# Patient Record
Sex: Female | Born: 2000 | Hispanic: Yes | Marital: Single | Smoking: Never smoker
Health system: Southern US, Community
[De-identification: ages and names within clinical notes are randomized; demographics above are authoritative.]

## PROBLEM LIST (undated history)

## (undated) DIAGNOSIS — J45909 Unspecified asthma, uncomplicated: Secondary | ICD-10-CM

## (undated) DIAGNOSIS — K219 Gastro-esophageal reflux disease without esophagitis: Secondary | ICD-10-CM

## (undated) DIAGNOSIS — F32A Depression, unspecified: Secondary | ICD-10-CM

## (undated) DIAGNOSIS — F419 Anxiety disorder, unspecified: Secondary | ICD-10-CM

## (undated) DIAGNOSIS — T7840XA Allergy, unspecified, initial encounter: Secondary | ICD-10-CM

## (undated) DIAGNOSIS — Z973 Presence of spectacles and contact lenses: Secondary | ICD-10-CM

## (undated) DIAGNOSIS — F3181 Bipolar II disorder: Secondary | ICD-10-CM

## (undated) DIAGNOSIS — L309 Dermatitis, unspecified: Secondary | ICD-10-CM

## (undated) HISTORY — DX: Allergy, unspecified, initial encounter: T78.40XA

## (undated) HISTORY — DX: Bipolar II disorder: F31.81

## (undated) HISTORY — PX: NO PAST SURGERIES: SHX2092

## (undated) HISTORY — DX: Gastro-esophageal reflux disease without esophagitis: K21.9

## (undated) HISTORY — DX: Depression, unspecified: F32.A

## (undated) HISTORY — DX: Anxiety disorder, unspecified: F41.9

---
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
## 2018-06-11 ENCOUNTER — Emergency Department
Admission: EM | Admit: 2018-06-11 | Discharge: 2018-06-11 | Disposition: A | Discharge status: Home / Self Care | Payer: No Typology Code available for payment source | Attending: Emergency Medicine | Admitting: Emergency Medicine

## 2018-06-11 ENCOUNTER — Other Ambulatory Visit: Payer: Self-pay

## 2018-06-11 ENCOUNTER — Emergency Department: Payer: No Typology Code available for payment source

## 2018-06-11 DIAGNOSIS — Y990 Civilian activity done for income or pay: Secondary | ICD-10-CM | POA: Diagnosis not present

## 2018-06-11 DIAGNOSIS — J45909 Unspecified asthma, uncomplicated: Secondary | ICD-10-CM | POA: Insufficient documentation

## 2018-06-11 DIAGNOSIS — S0990XA Unspecified injury of head, initial encounter: Secondary | ICD-10-CM | POA: Diagnosis present

## 2018-06-11 DIAGNOSIS — W01198A Fall on same level from slipping, tripping and stumbling with subsequent striking against other object, initial encounter: Secondary | ICD-10-CM | POA: Diagnosis not present

## 2018-06-11 DIAGNOSIS — S060X0A Concussion without loss of consciousness, initial encounter: Secondary | ICD-10-CM | POA: Diagnosis not present

## 2018-06-11 DIAGNOSIS — Y9389 Activity, other specified: Secondary | ICD-10-CM | POA: Insufficient documentation

## 2018-06-11 DIAGNOSIS — W19XXXA Unspecified fall, initial encounter: Secondary | ICD-10-CM

## 2018-06-11 DIAGNOSIS — Y929 Unspecified place or not applicable: Secondary | ICD-10-CM | POA: Insufficient documentation

## 2018-06-11 HISTORY — DX: Dermatitis, unspecified: L30.9

## 2018-06-11 HISTORY — DX: Unspecified asthma, uncomplicated: J45.909

## 2018-06-11 MED ORDER — MELOXICAM 15 MG PO TABS: 15.0000 mg | ORAL_TABLET | Freq: Every day | ORAL | 0 refills | Status: DC

## 2018-06-11 NOTE — ED Notes (Signed)
See PA assessment. Neurologically intact, no n/v, no LoC. Pt fell at work, struck head on floor, became dizzy and started dropping things.

## 2018-06-11 NOTE — ED Notes (Signed)
No orders at this time per MD [facility]

## 2018-06-11 NOTE — ED Provider Notes (Signed)
[facility] Emergency Department Provider Note  ____________________________________________  Time seen: Approximately 10:56 PM  I have reviewed the triage vital signs and the nursing notes.   HISTORY  Chief Complaint Head Injury    HPI Kelly Pace is a 17 y.o. female who presents the emergency department complaining of headache, dizziness, blurred vision, dropping items status post a head injury.  Patient was at work when she slipped, fell hitting her head on the floor.  Patient reports that she did not lose consciousness but immediately had a headache.  Patient is felt dizzy throughout the day, having intermittent blurred vision.  Patient reports that her coworkers have told her that she is not acting her normal self.  She is also found herself dropping items without provocation.  Patient denies any neck pain, chest pain, shortness of breath, abdominal pain, nausea or vomiting.  No medication for this complaint prior to arrival.  No previous history of concussion.  No other complaints at this time.    Past Medical History:  Diagnosis Date  . Asthma   . Eczema     There are no active problems to display for this patient.   History reviewed. No pertinent surgical history.  Prior to Admission medications   Medication Sig Start Date End Date Taking? Authorizing Provider  meloxicam (MOBIC) 15 MG tablet Take 1 tablet (15 mg total) by mouth daily. 06/11/18   Cuthriell, Delorise Royals, PA-C    Allergies Cashew nut oil and Fish allergy  No family history on file.  Social History Social History   Tobacco Use  . Smoking status: Never Smoker  . Smokeless tobacco: Never Used  Substance Use Topics  . Alcohol use: Never    Frequency: Never  . Drug use: Not on file     Review of Systems  Constitutional: No fever/chills Eyes: No visual changes.  Cardiovascular: no chest pain. Respiratory: no cough. No SOB. Gastrointestinal: No abdominal pain.  No nausea,  no vomiting. Musculoskeletal: Negative for musculoskeletal pain. Skin: Negative for rash, abrasions, lacerations, ecchymosis. Neurological: Positive for headache but denies focal weakness or numbness.  Dizziness.  Not acting "normal self" per coworkers 10-point ROS otherwise negative.  ____________________________________________   PHYSICAL EXAM:  VITAL SIGNS: ED Triage Vitals  Enc Vitals Group     BP 06/11/18 2128 124/74     Pulse Rate 06/11/18 2128 (!) 107     Resp 06/11/18 2128 18     Temp 06/11/18 2128 98.6 F (37 C)     Temp Source 06/11/18 2128 Oral     SpO2 06/11/18 2128 94 %     Weight 06/11/18 2129 173 lb (78.5 kg)     Height 06/11/18 2129 5\' 1"  (1.549 m)     Head Circumference --      Peak Flow --      Pain Score 06/11/18 2129 7     Pain Loc --      Pain Edu? --      Excl. in GC? --      Constitutional: Alert and oriented. Well appearing and in no acute distress. Eyes: Conjunctivae are normal. PERRL. EOMI. Head: Atraumatic.  No visible hematoma, urgency or lacerations.  Diffuse tenderness to palpation of the occipital skull with no palpable abnormality or crepitus.  No battle signs, raccoon eyes, serosanguineous fluid drainage from the ears or nares. ENT:      Ears:       Nose: No congestion/rhinnorhea.      Mouth/Throat:  Mucous membranes are moist.  Neck: No stridor.  No cervical spine tenderness to palpation.  Cardiovascular: Normal rate, regular rhythm. Normal S1 and S2.  Good peripheral circulation. Respiratory: Normal respiratory effort without tachypnea or retractions. Lungs CTAB. Good air entry to the bases with no decreased or absent breath sounds. Musculoskeletal: Full range of motion to all extremities. No gross deformities appreciated. Neurologic:  Normal speech and language. No gross focal neurologic deficits are appreciated.  Skin:  Skin is warm, dry and intact. No rash noted.  Cranial nerves II through XII grossly intact. Psychiatric: Mood and  affect are normal. Speech and behavior are normal. Patient exhibits appropriate insight and judgement.   ____________________________________________   LABS (all labs ordered are listed, but only abnormal results are displayed)  Labs Reviewed - No data to display ____________________________________________  EKG   ____________________________________________  RADIOLOGY I personally viewed and evaluated these images as part of my medical decision making, as well as reviewing the written report by the radiologist.  I concur with radiologist finding of no acute intracranial osseous abnormality.  Ct Head Wo Contrast  Result Date: 06/11/2018 CLINICAL DATA:  Headache EXAM: CT HEAD WITHOUT CONTRAST TECHNIQUE: Contiguous axial images were obtained from the base of the skull through the vertex without intravenous contrast. COMPARISON:  None. FINDINGS: Brain: No evidence of acute infarction, hemorrhage, hydrocephalus, extra-axial collection or mass lesion/mass effect. Vascular: No hyperdense vessel or unexpected calcification. Skull: Normal. Negative for fracture or focal lesion. Sinuses/Orbits: Mucosal thickening in the ethmoid sinuses. No acute orbital abnormality Other: None IMPRESSION: Negative non contrasted CT appearance of the brain Electronically Signed   By: Jasmine PangKim  Fujinaga M.D.   On: 06/11/2018 23:17    ____________________________________________    PROCEDURES  Procedure(s) performed:    Procedures    Medications - No data to display   ____________________________________________   INITIAL IMPRESSION / ASSESSMENT AND PLAN / ED COURSE  Pertinent labs & imaging results that were available during my care of the patient were reviewed by me and considered in my medical decision making (see chart for details).  Review of the [place] CSRS was performed in accordance of the [facility] prior to dispensing any controlled drugs.      Patient's diagnosis is consistent with concussion.   Patient presents the emergency department with headache, dizziness, dropping items.  This occurred after falling and striking her head at work.  Exam was overall reassuring.  CT scan reveals no acute intracranial or osseous abnormality.  Patient symptoms is most consistent with concussion.  I discussed at length with patient and her mother signs symptoms of concussion, avoidance of further head trauma, avoidance of flashing lights and screen time.  I stressed the importance of rest, plenty of fluids.  Patient is to follow-up with primary care in 1 week for return to work clearance.  Patient will be prescribed Mobic for symptom relief of headaches. Patient is given ED precautions to return to the ED for any worsening or new symptoms.     ____________________________________________  FINAL CLINICAL IMPRESSION(S) / ED DIAGNOSES  Final diagnoses:  Concussion without loss of consciousness, initial encounter  Fall, initial encounter      NEW MEDICATIONS STARTED DURING THIS VISIT:  ED Discharge Orders        Ordered    meloxicam (MOBIC) 15 MG tablet  Daily     06/11/18 2325          This chart was dictated using voice recognition software/Dragon. Despite best efforts to proofread,  errors can occur which can change the meaning. Any change was purely unintentional.    Racheal Patches, PA-C 06/11/18 2328    Jeanmarie Plant, MD 06/12/18 [phone]

## 2018-06-11 NOTE — ED Notes (Signed)
Patient discharged to home per MD order. Patient in stable condition, and deemed medically cleared by ED provider for discharge. Discharge instructions reviewed with patient/family using "Teach Back"; verbalized understanding of medication education and administration, and information about follow-up care. Denies further concerns. ° °

## 2018-06-11 NOTE — ED Triage Notes (Signed)
Patient reports mechanical fall at work. Patient fell and struck her head on floor. Patient reports dizziness on standing, denies LOC and nausea. Patient c/o intermittent dizziness since fall/injury. Patient reports she has been dropping objects since fall, and that her coworkers informed her she was acting different. Patient AO X 4, answering questions appropriately. Pupils equal and reactive to light.

## 2019-08-24 ENCOUNTER — Other Ambulatory Visit: Payer: Self-pay

## 2019-08-24 ENCOUNTER — Encounter: Payer: Self-pay | Admitting: *Deleted

## 2019-08-26 ENCOUNTER — Other Ambulatory Visit
Admission: RE | Admit: 2019-08-26 | Discharge: 2019-08-26 | Disposition: A | Discharge status: Home / Self Care | Payer: BC Managed Care – PPO | Source: Ambulatory Visit | Attending: Otolaryngology | Admitting: Otolaryngology

## 2019-08-26 DIAGNOSIS — Z20828 Contact with and (suspected) exposure to other viral communicable diseases: Secondary | ICD-10-CM | POA: Diagnosis not present

## 2019-08-26 DIAGNOSIS — Z01812 Encounter for preprocedural laboratory examination: Secondary | ICD-10-CM | POA: Diagnosis present

## 2019-08-26 LAB — SARS CORONAVIRUS 2 (TAT 6-24 HRS): SARS Coronavirus 2: NEGATIVE

## 2019-08-30 NOTE — Discharge Instructions (Signed)
T & A INSTRUCTION SHEET - [facility]  Carloyn Manner, MD  [address] [place], [place] [address] TEL.  [phone]  INFORMATION SHEET FOR A TONSILLECTOMY AND ADENDOIDECTOMY  About Your Tonsils and Adenoids  The tonsils and adenoids are normal body tissues that are part of our immune system.  They normally help to protect [place] against diseases that may enter our mouth and nose. However, sometimes the tonsils and/or adenoids become too large and obstruct our breathing, especially at night.    If either of these things happen it helps to remove the tonsils and adenoids in order to become healthier. The operation to remove the tonsils and adenoids is called a tonsillectomy and adenoidectomy.  The Location of Your Tonsils and Adenoids  The tonsils are located in the back of the throat on both side and sit in a cradle of muscles. The adenoids are located in the roof of the mouth, behind the nose, and closely associated with the opening of the Eustachian tube to the ear.  Surgery on Tonsils and Adenoids  A tonsillectomy and adenoidectomy is a short operation which takes about thirty minutes.  This includes being put to sleep and being awakened. Tonsillectomies and adenoidectomies are performed at [facility] and may require observation period in the recovery room prior to going home. Children are required to remain in recovery for at least 45 minutes.   Following the Operation for a Tonsillectomy  A cautery machine is used to control bleeding. Bleeding from a tonsillectomy and adenoidectomy is minimal and postoperatively the risk of bleeding is approximately four percent, although this rarely life threatening.  After your tonsillectomy and adenoidectomy post-op care at home: 1. Our patients are able to go home the same day. You may be given prescriptions for pain medications, if indicated. 2. It is extremely important to  remember that fluid intake is of utmost importance after a tonsillectomy. The amount that you drink must be maintained in the postoperative period. A good indication of whether a child is getting enough fluid is whether his/her urine output is constant. As long as children are urinating or wetting their diaper every 6 - 8 hours this is usually enough fluid intake.   3. Although rare, this is a risk of some bleeding in the first ten days after surgery. This usually occurs between day five and nine postoperatively. This risk of bleeding is approximately four percent. If you or your child should have any bleeding you should remain calm and notify our office or go directly to the emergency room at [facility] where they will contact us. Our doctors are available seven days a week for notification. We recommend sitting up quietly in a chair, place an ice pack on the front of the neck and spitting out the blood gently until we are able to contact you. Adults should gargle gently with ice water and this may help stop the bleeding. If the bleeding does not stop after a short time, i.e. 10 to 15 minutes, or seems to be increasing again, please contact us or go to the hospital.   4. It is common for the pain to be worse at 5 - 7 days postoperatively. This occurs because the scab is peeling off and the mucous membrane (skin of the throat) is growing back where the tonsils were.   5. It is common for a low-grade fever, less than 102, during the first week  after a tonsillectomy and adenoidectomy. It is usually due to not drinking enough liquids, and we suggest your use liquid Tylenol (acetaminophen) or the pain medicine with Tylenol (acetaminophen) prescribed in order to keep your temperature below 102. Please follow the directions on the back of the bottle. °6. Recommendations for post-operative pain in children and adults: °a) For Children 12 and younger: Recommendations are for oral Tylenol  (acetaminophen) and oral Motrin (Ibuprofen) along with a prescription dose of Prednisolone which is a steroid to help with pain and swelling. Administer the Tylenol (acetaminophen) and Motrin as stated on bottle for patient's age/weight. Sometimes it may be necessary to alternate the Tylenol (acetaminophen) and Motrin for improved pain control. Motrin does last slightly longer so many patients benefit from being given this prior to bedtime. All children should avoid Aspirin products for 2 weeks following surgery. °b) For children over the age of 12: Tylenol (acetaminophen) is the preferred first choice for pain control. Depending on your child's size, sometimes they will be given a combination of Tylenol (acetaminophen) and hydrocodone medication or sometimes it will be recommended they take Motrin (ibuprofen) in addition to the Tylenol (acetaminophen). Narcotics should always be used with caution in children following surgery as they can suppress their breathing and switching to over the counter Tylenol (acetaminophen) and Motrin (ibuprofen) as soon as possible is recommended. All patients should avoid Aspirin products for 2 weeks following surgery. °c) Adults: Usually adults will require a narcotic pain medication following a tonsillectomy. This usually has either hydrocodone or oxycodone in it and can usually be taken every 4 to 6 hours as needed for moderate pain. If the medication does not have Tylenol (acetaminophen) in it, you may also supplement Tylenol (acetaminophen) as needed every 4 to 6 hours for breakthrough or mild pain. Adults are also given Viscous Lidocaine to swish and spit every 6 hours to help with topical pain. Adults should avoid Aspirin, Aleve, Motrin, and Ibuprofen products for 2 weeks following surgery as they can increase your risk of bleeding. °7. If you happen to look in the mirror or into your child's mouth you will see white/gray patches on the back of the throat. This is what a scab  looks like in the mouth and is normal after having a tonsillectomy and adenoidectomy. They will disappear once the tonsil areas heal completely. However, it may cause a noticeable odor, and this too will disappear with time.     °8. You or your child may experience ear pain after having a tonsillectomy and adenoidectomy.  This is called referred pain and comes from the throat, but it is felt in the ears.  Ear pain is quite common and expected. It will usually go away after ten days. There is usually nothing wrong with the ears, and it is primarily due to the healing area stimulating the nerve to the ear that runs along the side of the throat. Use either the prescribed pain medicine or Tylenol (acetaminophen) as needed.  °9. The throat tissues after a tonsillectomy are obviously sensitive. Smoking around children who have had a tonsillectomy significantly increases the risk of bleeding. DO NOT SMOKE! ° °What to Expect Each Day  °First Day at Home °1. Patients will be discharged home the same day.  °2. Drink at least four glasses of liquid a day. Clear, cool liquids are recommended. Fruit juices containing citric acid are not recommended because they tend to cause pain. Carbonated beverages are allowed if you pour them from glass   to glass to remove the bubbles as these tend to cause discomfort. Avoid alcoholic beverages.  3. Eat very soft foods such as soups, broth, jello, custard, pudding, ice cream, popsicles, applesauce, mashed potatoes, and in general anything that you can crush between your tongue and the roof of your mouth. Try adding [employer] Mix into your food for extra calories. It is not uncommon to lose 5 to 10 pounds of fluid weight. The weight will be gained back quickly once you're feeling better and drinking more.  4. Sleep with your head elevated on two pillows for about three days to help decrease the swelling.  5. DO NOT SMOKE!  Day Two  1. Rest as much as possible. Use common  sense in your activities.  2. Continue drinking at least four glasses of liquid per day.  3. Follow the soft diet.  4. Use your pain medication as needed.  Day Three  1. Advance your activity as you are able and continue to follow the previous day's suggestions.  Days Four Through Six  1. Advance your diet and begin to eat more solid foods such as chopped hamburger. 2. Advance your activities slowly. Children should be kept mostly around the house.  3. Not uncommonly, there will be more pain at this time. It is temporary, usually lasting a day or two.  Day Seven Through Ten  1. Most individuals by this time are able to return to work or school unless otherwise instructed. Consider sending children back to school for a half day on the first day back.   General Anesthesia, Adult, Care After This sheet gives you information about how to care for yourself after your procedure. Your health care provider may also give you more specific instructions. If you have problems or questions, contact your health care provider. What can I expect after the procedure? After the procedure, the following side effects are common:  Pain or discomfort at the IV site.  Nausea.  Vomiting.  Sore throat.  Trouble concentrating.  Feeling cold or chills.  Weak or tired.  Sleepiness and fatigue.  Soreness and body aches. These side effects can affect parts of the body that were not involved in surgery. Follow these instructions at home:  For at least 24 hours after the procedure:  Have a responsible adult stay with you. It is important to have someone help care for you until you are awake and alert.  Rest as needed.  Do not: ? Participate in activities in which you could fall or become injured. ? Drive. ? Use heavy machinery. ? Drink alcohol. ? Take sleeping pills or medicines that cause drowsiness. ? Make important decisions or sign legal documents. ? Take care of children on your own. Eating  and drinking  Follow any instructions from your health care provider about eating or drinking restrictions.  When you feel hungry, start by eating small amounts of foods that are soft and easy to digest (bland), such as toast. Gradually return to your regular diet.  Drink enough fluid to keep your urine pale yellow.  If you vomit, rehydrate by drinking water, juice, or clear broth. General instructions  If you have sleep apnea, surgery and certain medicines can increase your risk for breathing problems. Follow instructions from your health care provider about wearing your sleep device: ? Anytime you are sleeping, including during daytime naps. ? While taking prescription pain medicines, sleeping medicines, or medicines that make you drowsy.  Return to your normal activities  as told by your health care provider. Ask your health care provider what activities are safe for you.  Take over-the-counter and prescription medicines only as told by your health care provider.  If you smoke, do not smoke without supervision.  Keep all follow-up visits as told by your health care provider. This is important. Contact a health care provider if:  You have nausea or vomiting that does not get better with medicine.  You cannot eat or drink without vomiting.  You have pain that does not get better with medicine.  You are unable to pass urine.  You develop a skin rash.  You have a fever.  You have redness around your IV site that gets worse. Get help right away if:  You have difficulty breathing.  You have chest pain.  You have blood in your urine or stool, or you vomit blood. Summary  After the procedure, it is common to have a sore throat or nausea. It is also common to feel tired.  Have a responsible adult stay with you for the first 24 hours after general anesthesia. It is important to have someone help care for you until you are awake and alert.  When you feel hungry, start by  eating small amounts of foods that are soft and easy to digest (bland), such as toast. Gradually return to your regular diet.  Drink enough fluid to keep your urine pale yellow.  Return to your normal activities as told by your health care provider. Ask your health care provider what activities are safe for you. This information is not intended to replace advice given to you by your health care provider. Make sure you discuss any questions you have with your health care provider. Document Released: 02/09/2001 Document Revised: 11/06/2017 Document Reviewed: 06/19/2017 Elsevier Patient Education  2020 [employer].

## 2019-08-30 NOTE — Anesthesia Preprocedure Evaluation (Addendum)
Anesthesia Evaluation  Patient identified by MRN, date of birth, ID band Patient awake    Reviewed: Allergy & Precautions, NPO status , Patient's Chart, lab work & pertinent test results  History of Anesthesia Complications Negative for: history of anesthetic complications  Airway Mallampati: III   Neck ROM: Full    Dental no notable dental hx.    Pulmonary asthma , former smoker,    Pulmonary exam normal breath sounds clear to auscultation       Cardiovascular Exercise Tolerance: Good negative cardio ROS Normal cardiovascular exam Rhythm:Regular Rate:Normal     Neuro/Psych negative neurological ROS     GI/Hepatic negative GI ROS,   Endo/Other  negative endocrine ROS  Renal/GU negative Renal ROS     Musculoskeletal   Abdominal   Peds  Hematology negative hematology ROS (+)   Anesthesia Other Findings   Reproductive/Obstetrics                           Anesthesia Physical Anesthesia Plan  ASA: II  Anesthesia Plan: General   Post-op Pain Management:    Induction: Intravenous  PONV Risk Score and Plan: 3 and Dexamethasone, Ondansetron and Scopolamine patch - Pre-op  Airway Management Planned: Oral ETT  Additional Equipment:   Intra-op Plan:   Post-operative Plan: Extubation in OR  Informed Consent: I have reviewed the patients History and Physical, chart, labs and discussed the procedure including the risks, benefits and alternatives for the proposed anesthesia with the patient or authorized representative who has indicated his/her understanding and acceptance.       Plan Discussed with: CRNA  Anesthesia Plan Comments:       Anesthesia Quick Evaluation

## 2019-08-31 ENCOUNTER — Ambulatory Visit: Payer: BC Managed Care – PPO | Admitting: Anesthesiology

## 2019-08-31 ENCOUNTER — Ambulatory Visit
Admission: RE | Admit: 2019-08-31 | Discharge: 2019-08-31 | Disposition: A | Discharge status: Home / Self Care | Payer: BC Managed Care – PPO | Attending: Otolaryngology | Admitting: Otolaryngology

## 2019-08-31 ENCOUNTER — Encounter
Admission: RE | Disposition: A | Discharge status: Home / Self Care | Payer: Self-pay | Source: Home / Self Care | Attending: Otolaryngology

## 2019-08-31 ENCOUNTER — Other Ambulatory Visit: Payer: Self-pay

## 2019-08-31 DIAGNOSIS — J3501 Chronic tonsillitis: Secondary | ICD-10-CM | POA: Insufficient documentation

## 2019-08-31 DIAGNOSIS — Z87891 Personal history of nicotine dependence: Secondary | ICD-10-CM | POA: Insufficient documentation

## 2019-08-31 DIAGNOSIS — Z91048 Other nonmedicinal substance allergy status: Secondary | ICD-10-CM | POA: Insufficient documentation

## 2019-08-31 DIAGNOSIS — J45909 Unspecified asthma, uncomplicated: Secondary | ICD-10-CM | POA: Diagnosis not present

## 2019-08-31 HISTORY — PX: TONSILLECTOMY: SHX5217

## 2019-08-31 HISTORY — DX: Presence of spectacles and contact lenses: Z97.3

## 2019-08-31 LAB — POCT PREGNANCY, URINE: Preg Test, Ur: NEGATIVE

## 2019-08-31 SURGERY — TONSILLECTOMY
Anesthesia: General | Site: Throat

## 2019-08-31 MED ORDER — DEXAMETHASONE SODIUM PHOSPHATE 4 MG/ML IJ SOLN
INTRAMUSCULAR | Status: DC | PRN
  Administered 2019-08-31: 10 mg via INTRAVENOUS

## 2019-08-31 MED ORDER — PROPOFOL 10 MG/ML IV BOLUS
INTRAVENOUS | Status: DC | PRN
  Administered 2019-08-31: 140 mg via INTRAVENOUS

## 2019-08-31 MED ORDER — LIDOCAINE VISCOUS HCL 2 % MT SOLN: 10.0000 mL | Freq: Four times a day (QID) | OROMUCOSAL | 0 refills | Status: DC | PRN

## 2019-08-31 MED ORDER — ONDANSETRON HCL 4 MG PO TABS: 4.0000 mg | ORAL_TABLET | Freq: Three times a day (TID) | ORAL | 0 refills | Status: DC | PRN

## 2019-08-31 MED ORDER — LIDOCAINE HCL (CARDIAC) PF 100 MG/5ML IV SOSY
PREFILLED_SYRINGE | INTRAVENOUS | Status: DC | PRN
  Administered 2019-08-31: 50 mg via INTRAVENOUS

## 2019-08-31 MED ORDER — ACETAMINOPHEN 325 MG PO TABS: 325.0000 mg | ORAL_TABLET | ORAL | Status: DC | PRN

## 2019-08-31 MED ORDER — GLYCOPYRROLATE 0.2 MG/ML IJ SOLN
INTRAMUSCULAR | Status: DC | PRN
  Administered 2019-08-31: 0.1 mg via INTRAVENOUS

## 2019-08-31 MED ORDER — BUPIVACAINE HCL (PF) 0.25 % IJ SOLN
INTRAMUSCULAR | Status: DC | PRN
  Administered 2019-08-31: 1 mL

## 2019-08-31 MED ORDER — LACTATED RINGERS IV SOLN
INTRAVENOUS | Status: DC
  Administered 2019-08-31: 07:00:00 via INTRAVENOUS

## 2019-08-31 MED ORDER — OXYCODONE HCL 5 MG/5ML PO SOLN: 5.0000 mg | ORAL | 0 refills | Status: AC | PRN

## 2019-08-31 MED ORDER — ACETAMINOPHEN 160 MG/5ML PO SOLN: 325.0000 mg | ORAL | Status: DC | PRN

## 2019-08-31 MED ORDER — FENTANYL CITRATE (PF) 100 MCG/2ML IJ SOLN
INTRAMUSCULAR | Status: DC | PRN
  Administered 2019-08-31 (×3): 25 ug via INTRAVENOUS
  Administered 2019-08-31: 100 ug via INTRAVENOUS
  Administered 2019-08-31: 25 ug via INTRAVENOUS

## 2019-08-31 MED ORDER — OXYMETAZOLINE HCL 0.05 % NA SOLN
NASAL | Status: DC | PRN
  Administered 2019-08-31: 1 via TOPICAL

## 2019-08-31 MED ORDER — SUCCINYLCHOLINE CHLORIDE 20 MG/ML IJ SOLN
INTRAMUSCULAR | Status: DC | PRN
  Administered 2019-08-31: 80 mg via INTRAVENOUS

## 2019-08-31 MED ORDER — ONDANSETRON 4 MG PO TBDP
4.0000 mg | ORAL_TABLET | Freq: Once | ORAL | Status: AC
  Administered 2019-08-31: 4 mg via ORAL

## 2019-08-31 MED ORDER — OXYCODONE HCL 5 MG/5ML PO SOLN
5.0000 mg | Freq: Once | ORAL | Status: AC | PRN
  Administered 2019-08-31: 5 mg via ORAL

## 2019-08-31 MED ORDER — MIDAZOLAM HCL 5 MG/5ML IJ SOLN
INTRAMUSCULAR | Status: DC | PRN
  Administered 2019-08-31: 2 mg via INTRAVENOUS

## 2019-08-31 MED ORDER — ONDANSETRON HCL 4 MG/2ML IJ SOLN
INTRAMUSCULAR | Status: DC | PRN
  Administered 2019-08-31: 4 mg via INTRAVENOUS

## 2019-08-31 MED ORDER — SCOPOLAMINE 1 MG/3DAYS TD PT72
1.0000 | MEDICATED_PATCH | Freq: Once | TRANSDERMAL | Status: DC
  Administered 2019-08-31: 1.5 mg via TRANSDERMAL

## 2019-08-31 MED ORDER — DEXMEDETOMIDINE HCL 200 MCG/2ML IV SOLN
INTRAVENOUS | Status: DC | PRN
  Administered 2019-08-31: 10 ug via INTRAVENOUS

## 2019-08-31 MED ORDER — ACETAMINOPHEN 10 MG/ML IV SOLN
1000.0000 mg | Freq: Once | INTRAVENOUS | Status: AC
  Administered 2019-08-31: 1000 mg via INTRAVENOUS

## 2019-08-31 MED ORDER — OXYCODONE HCL 5 MG PO TABS: 5.0000 mg | ORAL_TABLET | Freq: Once | ORAL | Status: AC | PRN

## 2019-08-31 MED ORDER — FENTANYL CITRATE (PF) 100 MCG/2ML IJ SOLN
25.0000 ug | INTRAMUSCULAR | Status: DC | PRN
  Administered 2019-08-31: 25 ug via INTRAVENOUS

## 2019-08-31 SURGICAL SUPPLY — 20 items
BLADE BOVIE TIP EXT 4 (BLADE) ×2 IMPLANT
CANISTER SUCT 1200ML W/VALVE (MISCELLANEOUS) ×2 IMPLANT
CATH ROBINSON RED A/P 10FR (CATHETERS) ×2 IMPLANT
COAG SUCT 10F 3.5MM HAND CTRL (MISCELLANEOUS) ×2 IMPLANT
ELECT REM PT RETURN 9FT ADLT (ELECTROSURGICAL) ×2
ELECTRODE REM PT RTRN 9FT ADLT (ELECTROSURGICAL) ×1 IMPLANT
GLOVE BIO SURGEON STRL SZ7.5 (GLOVE) ×3 IMPLANT
HANDLE SUCTION POOLE (INSTRUMENTS) ×1 IMPLANT
KIT TURNOVER KIT A (KITS) ×2 IMPLANT
NDL HYPO 25GX1X1/2 BEV (NEEDLE) ×1 IMPLANT
NEEDLE HYPO 25GX1X1/2 BEV (NEEDLE) ×2 IMPLANT
NS IRRIG 500ML POUR BTL (IV SOLUTION) ×1 IMPLANT
PACK TONSIL AND ADENOID CUSTOM (PACKS) ×2 IMPLANT
PENCIL SMOKE EVACUATOR (MISCELLANEOUS) ×2 IMPLANT
SLEEVE SUCTION 125 (MISCELLANEOUS) ×2 IMPLANT
SOL ANTI-FOG 6CC FOG-OUT (MISCELLANEOUS) ×1 IMPLANT
SOL FOG-OUT ANTI-FOG 6CC (MISCELLANEOUS) ×1
STRAP BODY AND KNEE 60X3 (MISCELLANEOUS) ×2 IMPLANT
SUCTION POOLE HANDLE (INSTRUMENTS) ×2
SYR 5ML LL (SYRINGE) ×2 IMPLANT

## 2019-08-31 NOTE — Transfer of Care (Signed)
Immediate Anesthesia Transfer of Care Note  Patient: Kelly Pace  Procedure(s) Performed: TONSILLECTOMY (N/A Throat)  Patient Location: PACU  Anesthesia Type: General  Level of Consciousness: awake, alert  and patient cooperative  Airway and Oxygen Therapy: Patient Spontanous Breathing and Patient connected to supplemental oxygen  Post-op Assessment: Post-op Vital signs reviewed, Patient's Cardiovascular Status Stable, Respiratory Function Stable, Patent Airway and No signs of Nausea or vomiting  Post-op Vital Signs: Reviewed and stable  Complications: No apparent anesthesia complications

## 2019-08-31 NOTE — Op Note (Signed)
..  08/31/2019  8:09 AM    [name]  [id number]   Pre-Op Dx:  chronic tonsilitis  Post-op Dx: chronic tonsilitis  Proc:Tonsillectomy and Adenoidectomy < age 18  Surg: [facility]  Anes:  General Endotracheal  EBL:  <90ml  Comp:  None  Findings:  3+ cryptic tonsils with significant fibrotic scar to the underlying musculature  Procedure: After the patient was identified in holding and the history and physical and consent was reviewed, the patient was taken to the operating room and placed in a supine position.  General endotracheal anesthesia was induced in the normal fashion.  At this time, the patient was rotated 45 degrees and a shoulder roll was placed.  At this time, a McIvor mouthgag was inserted into the patient's oral cavity and suspended from the [place] stand without injury to teeth, lips, or gums.  Next a red rubber catheter was inserted into the patient left nostril for retraction of the uvula and soft palate superiorly.  Next a curved Alice clamp was attached to the patient's right superior tonsillar pole and retracted medially and inferiorly.  A Bovie electrocautery was used to dissect the patient's right tonsil in a subcapsular plane.  Meticulous hemostasis was achieved with Bovie suction cautery.  At this time, the mouth gag was released from suspension for 1 minute.  Attention now was directed to the patient's left side.  In a similar fashion the curved Alice clamp was attached to the superior pole and this was retracted medially and inferiorly and the tonsil was excised in a subcapsular plane with Bovie electrocautery.  After completion of the second tonsil, meticulous hemostasis was continued.  At this time, the patient's nasal cavity and oral cavity was irrigated with sterile saline.  One ml of 0.25% Marcaine was injected into the anterior and posterior tonsillar fossa bilaterally.  Following this  The care of patient was returned to anesthesia, awakened, and  transferred to recovery in stable condition.  Dispo:  PACU to home  Plan: Soft diet.  Limit exercise and strenuous activity for 2 weeks.  Fluid hydration  Recheck my office three weeks.   [name] 8:09 AM 08/31/2019

## 2019-08-31 NOTE — Anesthesia Postprocedure Evaluation (Signed)
Anesthesia Post Note  Patient: Kelly Pace  Procedure(s) Performed: TONSILLECTOMY (N/A Throat)  Patient location during evaluation: PACU Anesthesia Type: General Level of consciousness: awake and alert, oriented and patient cooperative Pain management: pain level controlled Vital Signs Assessment: post-procedure vital signs reviewed and stable Respiratory status: spontaneous breathing, nonlabored ventilation and respiratory function stable Cardiovascular status: blood pressure returned to baseline and stable Postop Assessment: adequate PO intake Anesthetic complications: no    Darrin Nipper

## 2019-08-31 NOTE — H&P (Signed)
..  History and Physical paper copy reviewed and updated date of procedure and will be scanned into system.  Patient seen and examined.  

## 2019-08-31 NOTE — Anesthesia Procedure Notes (Addendum)
Procedure Name: Intubation Date/Time: 08/31/2019 7:37 AM Performed by: Mayme Genta, CRNA Pre-anesthesia Checklist: Patient identified, Emergency Drugs available, Suction available, Patient being monitored and Timeout performed Patient Re-evaluated:Patient Re-evaluated prior to induction Oxygen Delivery Method: Circle system utilized Preoxygenation: Pre-oxygenation with 100% oxygen Induction Type: IV induction Ventilation: Mask ventilation without difficulty Laryngoscope Size: Miller and 2 Grade View: Grade II Tube type: Oral Rae Tube size: 7.0 mm Number of attempts: 2 Airway Equipment and Method: Bougie stylet Placement Confirmation: ETT inserted through vocal cords under direct vision,  positive ETCO2 and breath sounds checked- equal and bilateral Tube secured with: Tape Dental Injury: Teeth and Oropharynx as per pre-operative assessment  Difficulty Due To: Difficult Airway- due to anterior larynx Comments: Grade II view on DL. On 2nd DL inserted boujie without difficulty and passed 7.0 oral ETT over boujie. +/=BBS.

## 2019-09-01 ENCOUNTER — Encounter: Payer: Self-pay | Admitting: Otolaryngology

## 2019-09-02 LAB — SURGICAL PATHOLOGY

## 2019-10-29 IMAGING — CT CT HEAD W/O CM
4 series · 16 of 47 positions shown, 18 images · non-contrast
Comparison: None.

CLINICAL DATA: Headache

EXAM:
CT HEAD WITHOUT CONTRAST
TECHNIQUE: Contiguous axial images were obtained from the base of the skull
through the vertex without intravenous contrast.

[Series 2: head wo · axial · 0.40mm/px · z∈[+398,+493]mm · 7 of 27 slices shown, 9 images]
[im 4/27  brain]
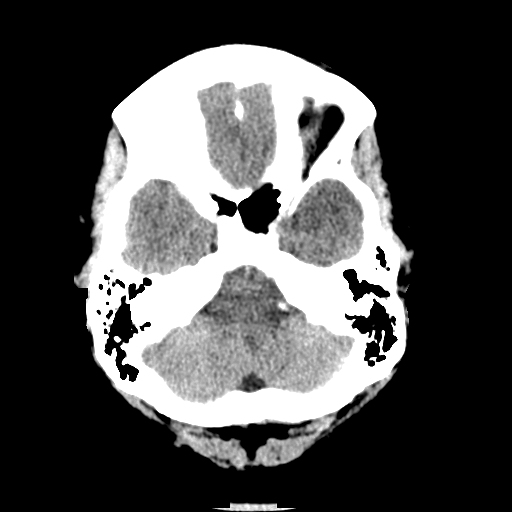
[im 4/27  bone]
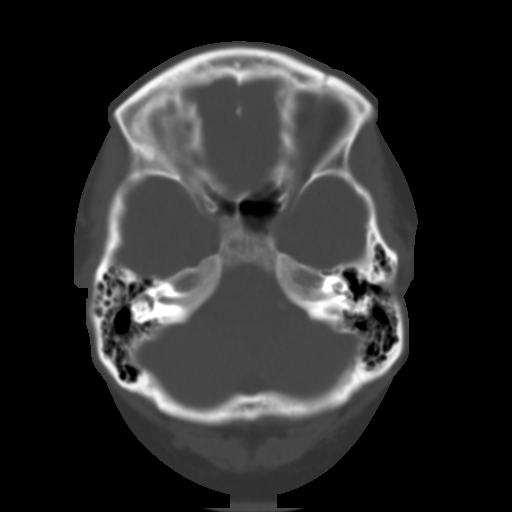
[im 7/27  brain]
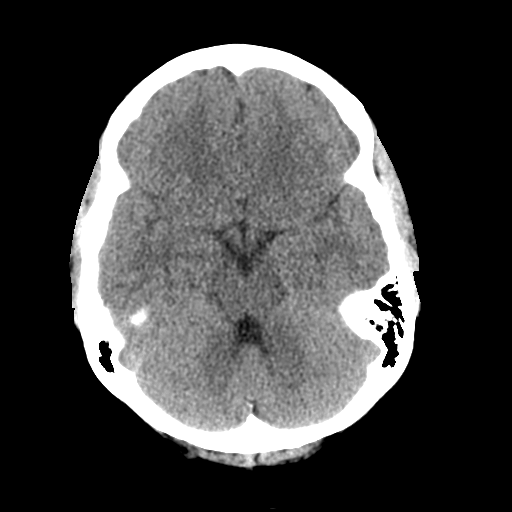
[im 10/27  brain]
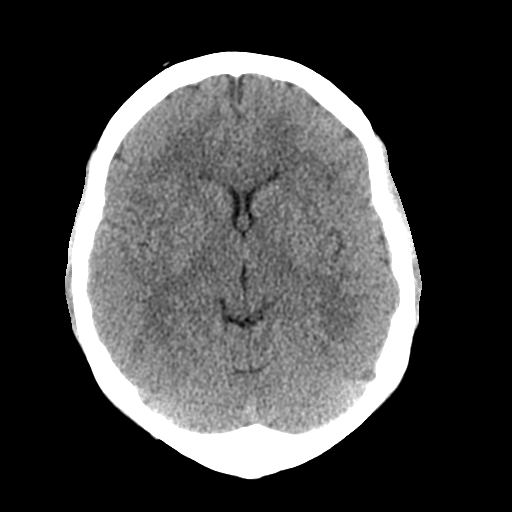
[im 14/27  brain]
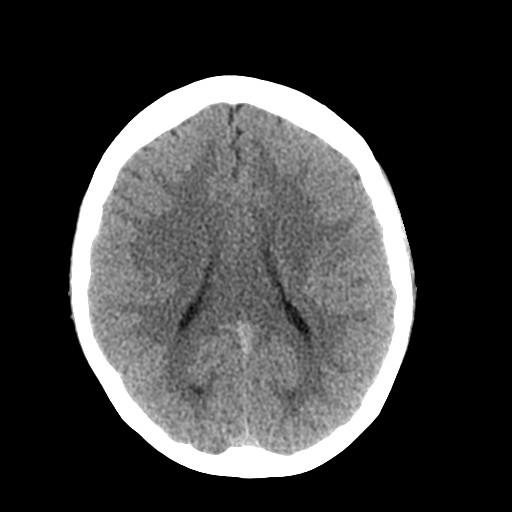
[im 17/27  brain]
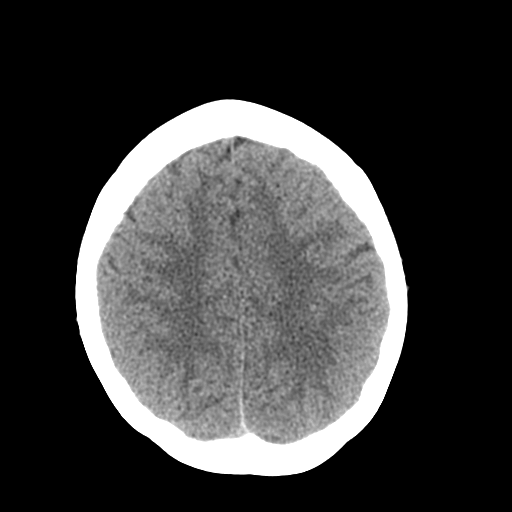
[im 17/27  bone]
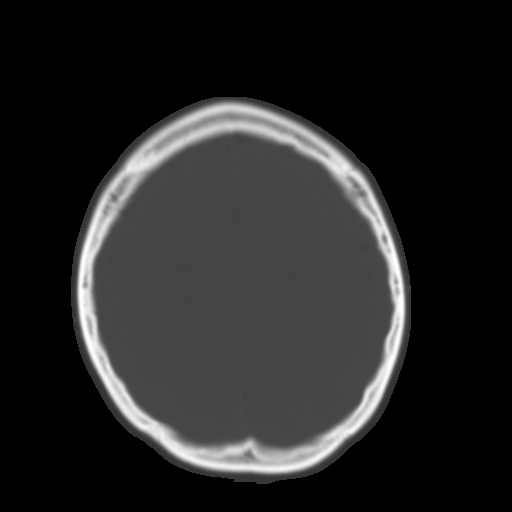
[im 20/27  brain]
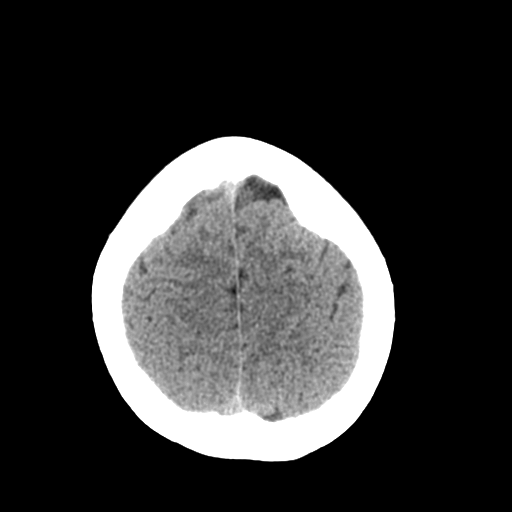
[im 23/27  brain]
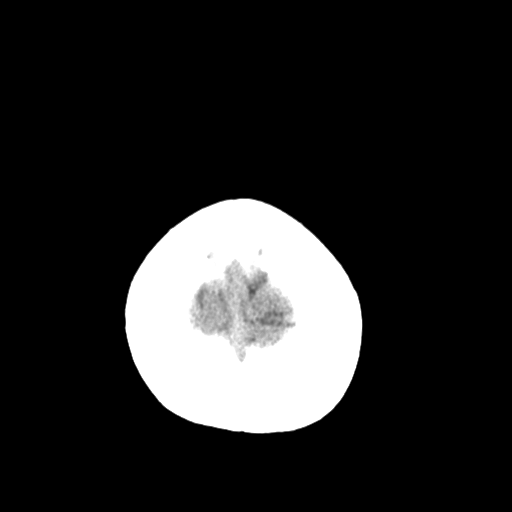

[Series 3: head bone · axial · 0.40mm/px · z∈[+395,+423]mm · 3 of 68 slices shown]
[im 7/68  bone]
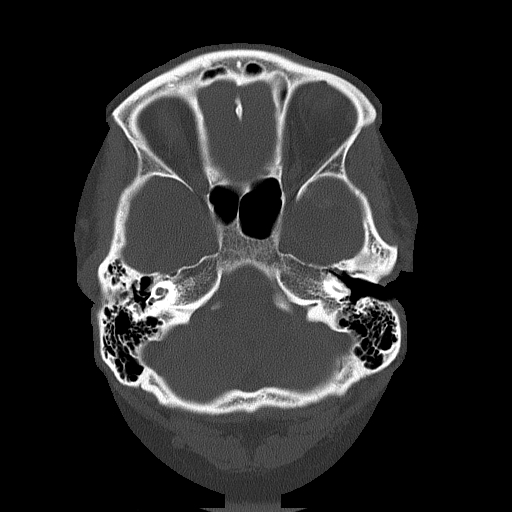
[im 14/68  bone]
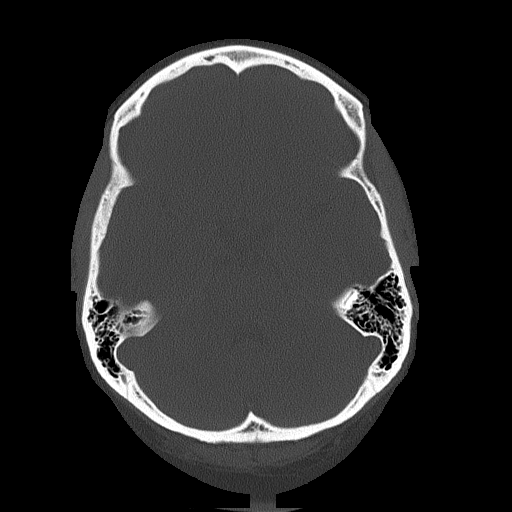
[im 21/68  bone]
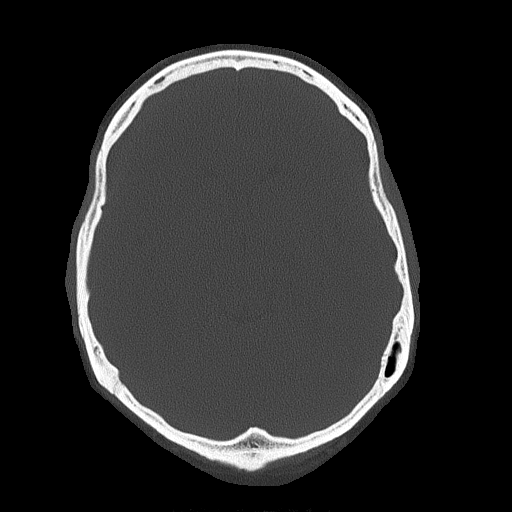

[Series 4: coronal soft tissue · coronal · 0.27mm/px · 3 of 60 slices shown]
[im 20/60  brain]
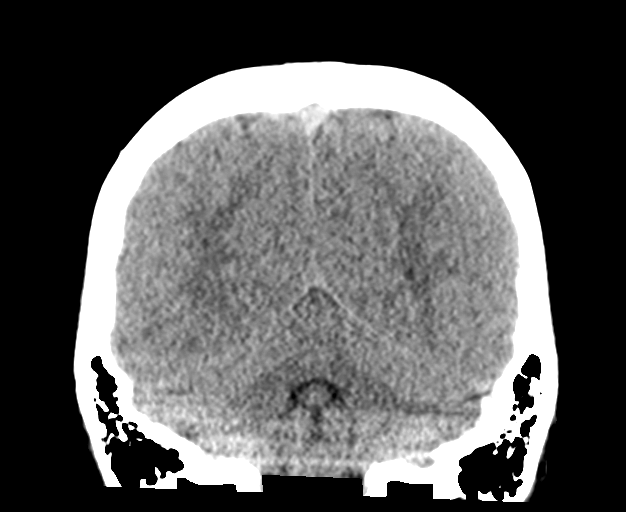
[im 27/60  brain]
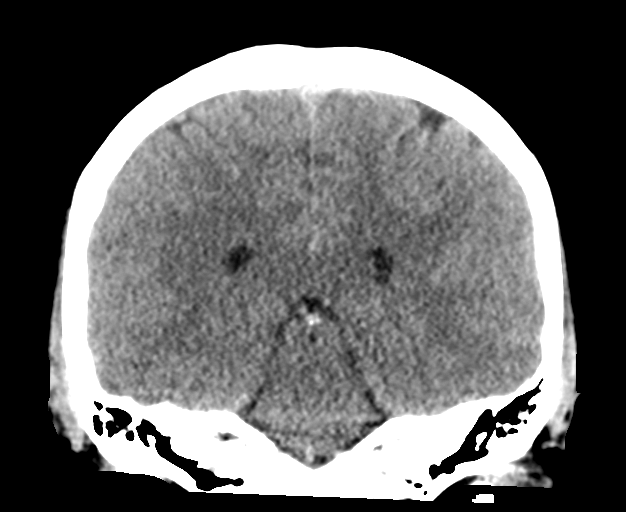
[im 33/60  brain]
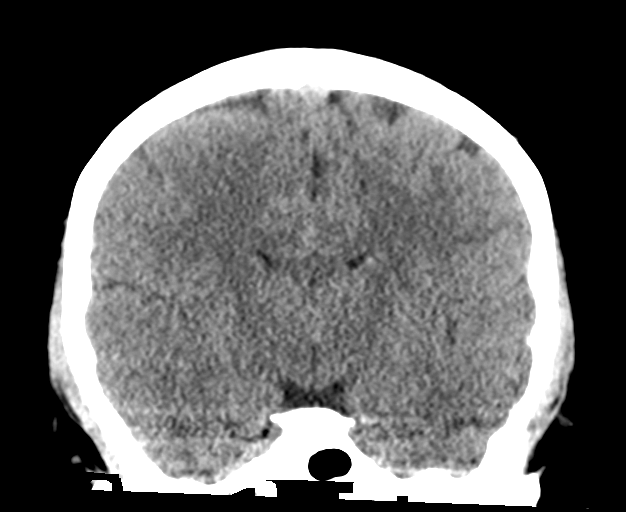

[Series 5: sagittal soft tissue · sagittal · 0.27mm/px · 3 of 50 slices shown]
[im 17/50  brain]
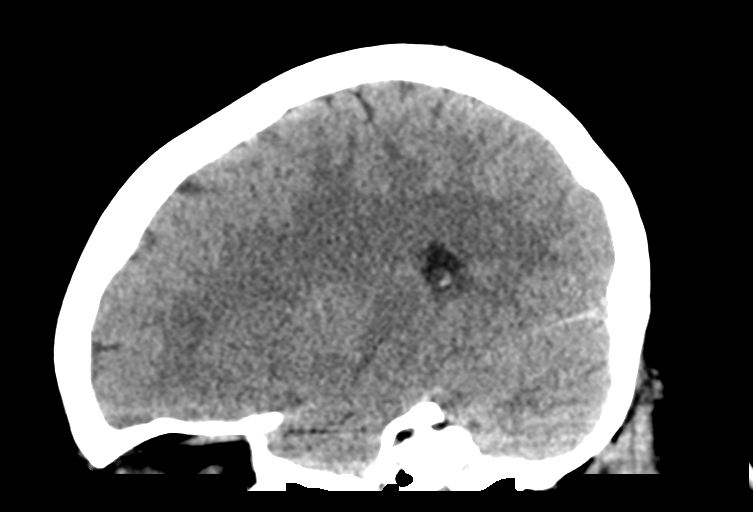
[im 25/50  brain]
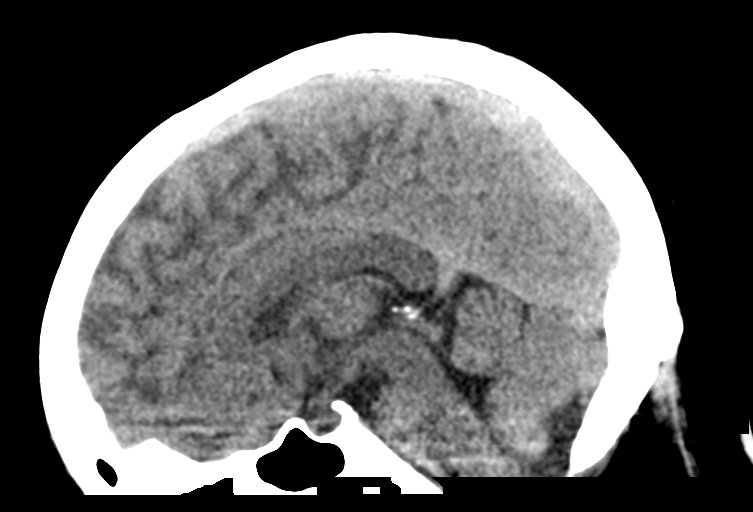
[im 33/50  brain]
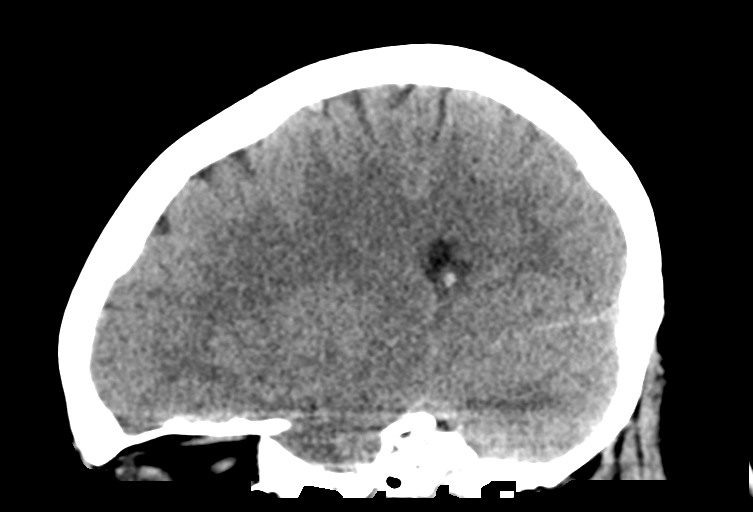

[16 of 47 positions shown; findings below may reference images not displayed]

FINDINGS: Brain: No evidence of acute infarction, hemorrhage, hydrocephalus,
extra-axial collection or mass lesion/mass effect.

Vascular: No hyperdense vessel or unexpected calcification.

Skull: Normal. Negative for fracture or focal lesion.

Sinuses/Orbits: Mucosal thickening in the ethmoid sinuses. No acute
orbital abnormality

Other: None
IMPRESSION: Negative non contrasted CT appearance of the brain

## 2020-01-08 DIAGNOSIS — Z20828 Contact with and (suspected) exposure to other viral communicable diseases: Secondary | ICD-10-CM | POA: Diagnosis not present

## 2020-01-27 ENCOUNTER — Encounter: Payer: BC Managed Care – PPO | Admitting: Obstetrics & Gynecology

## 2020-01-31 DIAGNOSIS — Z20828 Contact with and (suspected) exposure to other viral communicable diseases: Secondary | ICD-10-CM | POA: Diagnosis not present

## 2020-01-31 DIAGNOSIS — Z03818 Encounter for observation for suspected exposure to other biological agents ruled out: Secondary | ICD-10-CM | POA: Diagnosis not present

## 2020-03-19 ENCOUNTER — Encounter: Payer: Self-pay | Admitting: Psychiatry

## 2020-03-19 ENCOUNTER — Telehealth (INDEPENDENT_AMBULATORY_CARE_PROVIDER_SITE_OTHER): Payer: BC Managed Care – PPO | Admitting: Psychiatry

## 2020-03-19 ENCOUNTER — Other Ambulatory Visit: Payer: Self-pay

## 2020-03-19 DIAGNOSIS — F3181 Bipolar II disorder: Secondary | ICD-10-CM | POA: Diagnosis not present

## 2020-03-19 MED ORDER — SERTRALINE HCL 25 MG PO TABS: 25.0000 mg | ORAL_TABLET | Freq: Every day | ORAL | 1 refills | Status: DC

## 2020-03-19 MED ORDER — LAMOTRIGINE 25 MG PO TABS: ORAL_TABLET | ORAL | 1 refills | Status: DC

## 2020-03-19 NOTE — Progress Notes (Signed)
Psychiatric Initial Adult Assessment   I connected with  Kelly Pace on 03/19/20 by a video enabled telemedicine application and verified that I am speaking with the correct person using two identifiers.   I discussed the limitations of evaluation and management by telemedicine. The patient expressed understanding and agreed to proceed.    Patient Identification: Kelly Pace MRN:  [id number] Date of Evaluation:  03/19/2020   Referral Source: PCP  Chief Complaint:   " I want to be tested for bipolar disorder."  Visit Diagnosis:    ICD-10-CM   1. Bipolar 2 disorder (HCC)  F31.81     History of Present Illness: This is a 19 year old female with no prior psychiatry history now seen for evaluation after being referred by PCP.  Patient stated that she works at [employer] and has been working there for past 3 years.  She informed that about 4 months ago her boss at work told her that she needs to get evaluated as she is displaying a lot of significant changes in her behavior.  Patient reported that around that time she had displayed very frustrated behavior and had walked out on him which prompted him to talk to her about this.  She informed that her boss and herself have noticed that for the past 2 years she is easily irritated and gets upset frequently.  She may get triggered by trivial issues and that may escalate to altercations.  She informed that over the past couple of years she has been isolating herself and does not interact much with others.  She was in a relationship however she no longer is seeing that person. She informed that she has lot of anxiety and heart racing as soon as she wakes up in the morning.  And she feels like she has no energy to take care of herself as the day progresses.  At times she feels like she has no energy to get out of bed.  She informed that a few days ago she called out sick as she had no energy to get out of bed.  She feels depressed frequently.  She  reported poor concentration.  She reported having difficulty in falling asleep and also reported waking up frequently during the night.  She denied any suicidal ideations or suicide attempts. She denied any history of nonsuicidal self-injurious behaviors. She stated that she has days when she has elevated mood and increased energy levels.  She informed that around those days she is very happy and talks very fast.  She complained of racing thoughts and also reported jumping from 1 topic to another in conversations.  She stated that sometimes she talks so fast that she starts to stutter and her friends noticed and sometimes tell her to shut up.  She noticed that she can be very impulsive at times and spend money on things that she does not need.  She gave an example of how she spent over $200 on a new apple watch.  She also impulsively got a tattoo on her arm. She also reported having grandiose ideations of being the best employee.  She denied any inappropriate sexual encounters. She denied any legal encounters.  She denied any symptom suggestive of psychosis or PTSD.  Past Psychiatric History: none  Previous Psychotropic Medications: No   Substance Abuse History in the last 12 months:  No.  Consequences of Substance Abuse: Negative  Past Medical History:  Past Medical History:  Diagnosis Date  . Asthma   .  Eczema   . Wears contact lenses     Past Surgical History:  Procedure Laterality Date  . NO PAST SURGERIES    . TONSILLECTOMY N/A 08/31/2019   Procedure: TONSILLECTOMY;  Surgeon: Bud Face, MD;  Location: [facility];  Service: ENT;  Laterality: N/A;    Family Psychiatric History: Denied  Family History: No family history on file.  Social History:   Social History   Socioeconomic History  . Marital status: Single    Spouse name: Not on file  . Number of children: Not on file  . Years of education: Not on file  . Highest education level: Not on file   Occupational History  . Not on file  Tobacco Use  . Smoking status: Former Games developer  . Smokeless tobacco: Never Used  . Tobacco comment: socially in HS  Substance and Sexual Activity  . Alcohol use: Never  . Drug use: Not on file  . Sexual activity: Not on file  Other Topics Concern  . Not on file  Social History Narrative  . Not on file   Social Determinants of Health   Financial Resource Strain:   . Difficulty of Paying Living Expenses:   Food Insecurity:   . Worried About Programme researcher, broadcasting/film/video in the Last Year:   . Barista in the Last Year:   Transportation Needs:   . Freight forwarder (Medical):   Marland Kitchen Lack of Transportation (Non-Medical):   Physical Activity:   . Days of Exercise per Week:   . Minutes of Exercise per Session:   Stress:   . Feeling of Stress :   Social Connections:   . Frequency of Communication with Friends and Family:   . Frequency of Social Gatherings with Friends and Family:   . Attends Religious Services:   . Active Member of Clubs or Organizations:   . Attends Banker Meetings:   Marland Kitchen Marital Status:     Additional Social History: Works for [employer], lives with her biological parents.  Has 2 older siblings were out of home.  Allergies:   Allergies  Allergen Reactions  . Cashew Nut Oil Anaphylaxis  . Fish Allergy Anaphylaxis    ALL seafood  . Watermelon [Citrullus Vulgaris] Itching    tongue  . Nickel Rash    Metabolic Disorder Labs: No results found for: HGBA1C, MPG No results found for: PROLACTIN No results found for: CHOL, TRIG, HDL, CHOLHDL, VLDL, LDLCALC No results found for: TSH  Therapeutic Level Labs: No results found for: LITHIUM No results found for: CBMZ No results found for: VALPROATE  Current Medications: Current Outpatient Medications  Medication Sig Dispense Refill  . etonogestrel (NEXPLANON) 68 MG IMPL implant 1 each by Subdermal route once.     No current facility-administered  medications for this visit.      Psychiatric Specialty Exam: Review of Systems  There were no vitals taken for this visit.There is no height or weight on file to calculate BMI.  General Appearance: Fairly Groomed  Eye Contact:  Good  Speech:  Clear and Coherent and Normal Rate  Volume:  Normal  Mood:  Anxious  Affect:  Congruent  Thought Process:  Goal Directed and Descriptions of Associations: Intact  Orientation:  Full (Time, Place, and Person)  Thought Content:  Logical  Suicidal Thoughts:  No  Homicidal Thoughts:  No  Memory:  Immediate;   Good Recent;   Good  Judgement:  Fair  Insight:  Fair  Psychomotor  Activity:  Normal  Concentration:  Concentration: Good and Attention Span: Good  Recall:  Good  Fund of Knowledge:Good  Language: Good  Akathisia:  Negative  Handed:  Right  AIMS (if indicated):  not done  Assets:  Communication Skills Desire for Improvement Financial Resources/Insurance Housing Transportation  ADL's:  Intact  Cognition: WNL  Sleep:  Poor     Assessment and Plan: Based on patient's history and evaluation, she meets criteria for bipolar 2 disorder.  Patient was offered Zoloft to target her depression and anxiety symptoms.  She was also offered Lamictal to target her mood symptoms. Potential side effects of medication and risks vs benefits of treatment vs non-treatment were explained and discussed. All questions were answered.   1. Bipolar 2 disorder (HCC)  -Start sertraline (ZOLOFT) 25 MG tablet; Take 1 tablet (25 mg total) by mouth daily.  Dispense: 30 tablet; Refill: 1 -Start lamoTRIgine (LAMICTAL) 25 MG tablet; Take 1 tablet for 2 weeks daily then take 2 tablets daily  Dispense: 60 tablet; Refill: 1  Follow-up in 6 weeks.  Zena Amos, MD 5/3/20212:49 PM

## 2020-05-03 ENCOUNTER — Telehealth (HOSPITAL_COMMUNITY): Payer: BC Managed Care – PPO | Admitting: Psychiatry

## 2020-05-03 ENCOUNTER — Telehealth: Payer: BC Managed Care – PPO | Admitting: Psychiatry

## 2020-05-08 ENCOUNTER — Telehealth (INDEPENDENT_AMBULATORY_CARE_PROVIDER_SITE_OTHER): Payer: BC Managed Care – PPO | Admitting: Psychiatry

## 2020-05-08 ENCOUNTER — Other Ambulatory Visit: Payer: Self-pay

## 2020-05-08 ENCOUNTER — Encounter (HOSPITAL_COMMUNITY): Payer: Self-pay | Admitting: Psychiatry

## 2020-05-08 DIAGNOSIS — F3181 Bipolar II disorder: Secondary | ICD-10-CM | POA: Diagnosis not present

## 2020-05-08 MED ORDER — TRAZODONE HCL 50 MG PO TABS: 50.0000 mg | ORAL_TABLET | Freq: Every evening | ORAL | 1 refills | Status: DC | PRN

## 2020-05-08 MED ORDER — SERTRALINE HCL 25 MG PO TABS: 25.0000 mg | ORAL_TABLET | Freq: Every day | ORAL | 1 refills | Status: DC

## 2020-05-08 MED ORDER — LAMOTRIGINE 25 MG PO TABS: ORAL_TABLET | ORAL | 1 refills | Status: DC

## 2020-05-08 NOTE — Progress Notes (Signed)
BH MD/PA/NP OP Progress Note  Virtual Visit via Video Note  I connected with Nyjai C Burrough on 05/08/20 at  2:00 PM EDT by a video enabled telemedicine application and verified that I am speaking with the correct person using two identifiers.  Location: Patient: Home Provider: Clinic   I discussed the limitations of evaluation and management by telemedicine and the availability of in person appointments. The patient expressed understanding and agreed to proceed.  I provided 18 minutes of non-face-to-face time during this encounter.     05/08/2020 2:08 PM Leisa MARYALICE PASLEY  MRN:  [id number]  Chief Complaint:  " My family says I am not snappy like I used to be."  HPI: Patient was seen about 6 weeks ago and was diagnosed with bipolar 2 disorder based on her symptoms.  She was prescribed sertraline 25 mg daily and Lamictal gradual titration to 50 mg daily.  Patient reported that after starting medication she did not see any dramatic effects.  She stated that she feels the same way and she cannot tell a big difference in herself.  However when probed patient did report that people around her have said that she is not snappy like she used to be.  She stated she is less irritable and is able to focus on her work better.  She is completing her tasks in a timely fashion.  She has noticed that her mood is stable and is not fluctuating like it used to.  She stated she has not been feeling depressed. She denied feeling overly anxious.  One of her friends commented that she was able to stay calm even under very stressful circumstances. Patient reported that she is still having difficulty with sleep.  She has a hard time falling asleep as well as staying asleep.  She wakes up frequently and has a hard time going back to sleep. She denies any hallucinations. She has tried over-the-counter melatonin in various forms however did not find it to be helpful at all.  She was agreeable to try a sleeping medication.   She was offered trazodone. Potential side effects of medication and risks vs benefits of treatment vs non-treatment were explained and discussed. All questions were answered.   Visit Diagnosis:    ICD-10-CM   1. Bipolar 2 disorder (HCC)  F31.81     Past Psychiatric History: recently diagnosed Bipolar 2 d/o, anxiety.  Past Medical History:  Past Medical History:  Diagnosis Date  . Asthma   . Eczema   . Wears contact lenses     Past Surgical History:  Procedure Laterality Date  . NO PAST SURGERIES    . TONSILLECTOMY N/A 08/31/2019   Procedure: TONSILLECTOMY;  Surgeon: Bud Face, MD;  Location: [facility];  Service: ENT;  Laterality: N/A;    Family Psychiatric History: denied  Family History: No family history on file.  Social History:  Social History   Socioeconomic History  . Marital status: Single    Spouse name: Not on file  . Number of children: Not on file  . Years of education: Not on file  . Highest education level: Not on file  Occupational History  . Not on file  Tobacco Use  . Smoking status: Former Games developer  . Smokeless tobacco: Never Used  . Tobacco comment: socially in HS  Vaping Use  . Vaping Use: Never used  Substance and Sexual Activity  . Alcohol use: Never  . Drug use: Not on file  . Sexual activity: Not  on file  Other Topics Concern  . Not on file  Social History Narrative  . Not on file   Social Determinants of Health   Financial Resource Strain:   . Difficulty of Paying Living Expenses:   Food Insecurity:   . Worried About Programme researcher, broadcasting/film/video in the Last Year:   . Barista in the Last Year:   Transportation Needs:   . Freight forwarder (Medical):   Marland Kitchen Lack of Transportation (Non-Medical):   Physical Activity:   . Days of Exercise per Week:   . Minutes of Exercise per Session:   Stress:   . Feeling of Stress :   Social Connections:   . Frequency of Communication with Friends and Family:   . Frequency of  Social Gatherings with Friends and Family:   . Attends Religious Services:   . Active Member of Clubs or Organizations:   . Attends Banker Meetings:   Marland Kitchen Marital Status:     Allergies:  Allergies  Allergen Reactions  . Cashew Nut Oil Anaphylaxis  . Fish Allergy Anaphylaxis    ALL seafood  . Watermelon [Citrullus Vulgaris] Itching    tongue  . Nickel Rash    Metabolic Disorder Labs: No results found for: HGBA1C, MPG No results found for: PROLACTIN No results found for: CHOL, TRIG, HDL, CHOLHDL, VLDL, LDLCALC No results found for: TSH  Therapeutic Level Labs: No results found for: LITHIUM No results found for: VALPROATE No components found for:  CBMZ  Current Medications: Current Outpatient Medications  Medication Sig Dispense Refill  . etonogestrel (NEXPLANON) 68 MG IMPL implant 1 each by Subdermal route once.    . lamoTRIgine (LAMICTAL) 25 MG tablet Take 1 tablet for 2 weeks daily then take 2 tablets daily 60 tablet 1  . sertraline (ZOLOFT) 25 MG tablet Take 1 tablet (25 mg total) by mouth daily. 30 tablet 1   No current facility-administered medications for this visit.     Psychiatric Specialty Exam: Review of Systems  There were no vitals taken for this visit.There is no height or weight on file to calculate BMI.  General Appearance: Well Groomed  Eye Contact:  Good  Speech:  Clear and Coherent and Normal Rate  Volume:  Normal  Mood:  Euthymic  Affect:  Congruent  Thought Process:  Goal Directed and Descriptions of Associations: Intact  Orientation:  Full (Time, Place, and Person)  Thought Content: Logical   Suicidal Thoughts:  No  Homicidal Thoughts:  No  Memory:  Immediate;   Good Recent;   Good  Judgement:  Fair  Insight:  Fair  Psychomotor Activity:  Normal  Concentration:  Concentration: Good and Attention Span: Good  Recall:  Good  Fund of Knowledge: Good  Language: Good  Akathisia:  Negative  Handed:  Right  AIMS (if indicated):  not done  Assets:  Communication Skills Desire for Improvement Financial Resources/Insurance Housing Social Support  ADL's:  Intact  Cognition: WNL  Sleep:  Poor    Assessment and Plan: Patient appears to be doing better in terms of her mood and anxiety symptoms after being started on combination of Zoloft and Lamictal.  She is denying any side effects to her regimen.  She is still complaining of poor sleep and is agreeable to try trazodone. Potential side effects of medication and risks vs benefits of treatment vs non-treatment were explained and discussed. All questions were answered.  1. Bipolar 2 disorder (HCC) - sertraline (ZOLOFT) 25  MG tablet; Take 1 tablet (25 mg total) by mouth daily.  Dispense: 30 tablet; Refill: 1 - lamoTRIgine (LAMICTAL) 25 MG tablet; Take 2 tablets daily  Dispense: 60 tablet; Refill: 1 - traZODone (DESYREL) 50 MG tablet; Take 1 tablet (50 mg total) by mouth at bedtime as needed for sleep.  Dispense: 30 tablet; Refill: 1-patient was advised to start with half tablet for a few nights first and depending upon response can increase to a whole tablet.  Follow-up in 2 months.    Nevada Crane, MD 05/08/2020, 2:08 PM

## 2020-07-09 ENCOUNTER — Other Ambulatory Visit: Payer: Self-pay

## 2020-07-09 ENCOUNTER — Encounter (HOSPITAL_COMMUNITY): Payer: Self-pay | Admitting: Psychiatry

## 2020-07-09 ENCOUNTER — Telehealth (INDEPENDENT_AMBULATORY_CARE_PROVIDER_SITE_OTHER): Payer: BC Managed Care – PPO | Admitting: Psychiatry

## 2020-07-09 DIAGNOSIS — F3181 Bipolar II disorder: Secondary | ICD-10-CM

## 2020-07-09 MED ORDER — TRAZODONE HCL 50 MG PO TABS: 50.0000 mg | ORAL_TABLET | Freq: Every evening | ORAL | 1 refills | Status: DC | PRN

## 2020-07-09 MED ORDER — LAMOTRIGINE 25 MG PO TABS: ORAL_TABLET | ORAL | 1 refills | Status: DC

## 2020-07-09 MED ORDER — SERTRALINE HCL 25 MG PO TABS: 25.0000 mg | ORAL_TABLET | Freq: Every day | ORAL | 1 refills | Status: DC

## 2020-07-09 NOTE — Progress Notes (Signed)
BH MD/PA/NP OP Progress Note  Virtual Visit via Video Note  I connected with Kelly Pace on 07/09/20 at  3:30 PM EDT by a video enabled telemedicine application and verified that I am speaking with the correct person using two identifiers.  Location: Patient: Home Provider: Clinic   I discussed the limitations of evaluation and management by telemedicine and the availability of in person appointments. The patient expressed understanding and agreed to proceed.  I provided 13 minutes of non-face-to-face time during this encounter.     07/09/2020 3:09 PM Kelly Pace  MRN:  [id number]  Chief Complaint:  " I am doing well."  HPI: Patient reported that she is doing well.  She informed that her mood is stable.  She denied any difficulties at work.  She reported that her family has noted she is happy and is not as angry and upset as she used to be.  Her sleep has improved with help of trazodone.  She stated that she would like to continue the same regimen at same dose for now. She was noted To be wearing her work uniform and was at work.  Visit Diagnosis:    ICD-10-CM   1. Bipolar 2 disorder (HCC)  F31.81     Past Psychiatric History: recently diagnosed Bipolar 2 d/o, anxiety.  Past Medical History:  Past Medical History:  Diagnosis Date  . Asthma   . Eczema   . Wears contact lenses     Past Surgical History:  Procedure Laterality Date  . NO PAST SURGERIES    . TONSILLECTOMY N/A 08/31/2019   Procedure: TONSILLECTOMY;  Surgeon: Bud Face, MD;  Location: [facility];  Service: ENT;  Laterality: N/A;    Family Psychiatric History: denied  Family History: No family history on file.  Social History:  Social History   Socioeconomic History  . Marital status: Single    Spouse name: Not on file  . Number of children: Not on file  . Years of education: Not on file  . Highest education level: Not on file  Occupational History  . Not on file  Tobacco Use   . Smoking status: Former Games developer  . Smokeless tobacco: Never Used  . Tobacco comment: socially in HS  Vaping Use  . Vaping Use: Never used  Substance and Sexual Activity  . Alcohol use: Never  . Drug use: Not on file  . Sexual activity: Not on file  Other Topics Concern  . Not on file  Social History Narrative  . Not on file   Social Determinants of Health   Financial Resource Strain:   . Difficulty of Paying Living Expenses: Not on file  Food Insecurity:   . Worried About Programme researcher, broadcasting/film/video in the Last Year: Not on file  . Ran Out of Food in the Last Year: Not on file  Transportation Needs:   . Lack of Transportation (Medical): Not on file  . Lack of Transportation (Non-Medical): Not on file  Physical Activity:   . Days of Exercise per Week: Not on file  . Minutes of Exercise per Session: Not on file  Stress:   . Feeling of Stress : Not on file  Social Connections:   . Frequency of Communication with Friends and Family: Not on file  . Frequency of Social Gatherings with Friends and Family: Not on file  . Attends Religious Services: Not on file  . Active Member of Clubs or Organizations: Not on file  . Attends  Club or Organization Meetings: Not on file  . Marital Status: Not on file    Allergies:  Allergies  Allergen Reactions  . Cashew Nut Oil Anaphylaxis  . Fish Allergy Anaphylaxis    ALL seafood  . Watermelon [Citrullus Vulgaris] Itching    tongue  . Nickel Rash    Metabolic Disorder Labs: No results found for: HGBA1C, MPG No results found for: PROLACTIN No results found for: CHOL, TRIG, HDL, CHOLHDL, VLDL, LDLCALC No results found for: TSH  Therapeutic Level Labs: No results found for: LITHIUM No results found for: VALPROATE No components found for:  CBMZ  Current Medications: Current Outpatient Medications  Medication Sig Dispense Refill  . etonogestrel (NEXPLANON) 68 MG IMPL implant 1 each by Subdermal route once.    . lamoTRIgine (LAMICTAL) 25  MG tablet Take 2 tablets daily 60 tablet 1  . sertraline (ZOLOFT) 25 MG tablet Take 1 tablet (25 mg total) by mouth daily. 30 tablet 1  . traZODone (DESYREL) 50 MG tablet Take 1 tablet (50 mg total) by mouth at bedtime as needed for sleep. 30 tablet 1   No current facility-administered medications for this visit.     Psychiatric Specialty Exam: Review of Systems  There were no vitals taken for this visit.There is no height or weight on file to calculate BMI.  General Appearance: Well Groomed, wearing work uniform  Eye Contact:  Good  Speech:  Clear and Coherent and Normal Rate  Volume:  Normal  Mood:  Euthymic  Affect:  Congruent  Thought Process:  Goal Directed and Descriptions of Associations: Intact  Orientation:  Full (Time, Place, and Person)  Thought Content: Logical   Suicidal Thoughts:  No  Homicidal Thoughts:  No  Memory:  Immediate;   Good Recent;   Good  Judgement:  Fair  Insight:  Fair  Psychomotor Activity:  Normal  Concentration:  Concentration: Good and Attention Span: Good  Recall:  Good  Fund of Knowledge: Good  Language: Good  Akathisia:  Negative  Handed:  Right  AIMS (if indicated): not done  Assets:  Communication Skills Desire for Improvement Financial Resources/Insurance Housing Social Support  ADL's:  Intact  Cognition: WNL  Sleep:  Good    Assessment and Plan: Patient appears to be stable on her current regimen.  1. Bipolar 2 disorder (HCC) - sertraline (ZOLOFT) 25 MG tablet; Take 1 tablet (25 mg total) by mouth daily.  Dispense: 30 tablet; Refill: 1 - lamoTRIgine (LAMICTAL) 25 MG tablet; Take 2 tablets daily  Dispense: 60 tablet; Refill: 1 - traZODone (DESYREL) 50 MG tablet; Take 1 tablet (50 mg total) by mouth at bedtime as needed for sleep.  Dispense: 30 tablet; Refill: 1   Follow-up in 2 months.    Zena Amos, MD 07/09/2020, 3:09 PM

## 2020-08-08 ENCOUNTER — Other Ambulatory Visit (HOSPITAL_COMMUNITY): Payer: Self-pay | Admitting: Psychiatry

## 2020-08-08 DIAGNOSIS — F3181 Bipolar II disorder: Secondary | ICD-10-CM

## 2020-09-04 ENCOUNTER — Other Ambulatory Visit: Payer: Self-pay

## 2020-09-04 ENCOUNTER — Telehealth (INDEPENDENT_AMBULATORY_CARE_PROVIDER_SITE_OTHER): Payer: BC Managed Care – PPO | Admitting: Psychiatry

## 2020-09-04 ENCOUNTER — Encounter (HOSPITAL_COMMUNITY): Payer: Self-pay | Admitting: Psychiatry

## 2020-09-04 DIAGNOSIS — F3181 Bipolar II disorder: Secondary | ICD-10-CM | POA: Diagnosis not present

## 2020-09-04 MED ORDER — DOXEPIN HCL 25 MG PO CAPS: ORAL_CAPSULE | ORAL | 1 refills | Status: DC

## 2020-09-04 MED ORDER — LAMOTRIGINE 25 MG PO TABS: ORAL_TABLET | ORAL | 1 refills | Status: DC

## 2020-09-04 MED ORDER — SERTRALINE HCL 25 MG PO TABS: 25.0000 mg | ORAL_TABLET | Freq: Every day | ORAL | 1 refills | Status: DC

## 2020-09-04 NOTE — Progress Notes (Signed)
BH MD/PA/NP OP Progress Note  Virtual Visit via Video Note  I connected with Kelly Pace on 09/04/20 at  3:00 PM EDT by a video enabled telemedicine application and verified that I am speaking with the correct person using two identifiers.  Location: Patient: Home Provider: Clinic   I discussed the limitations of evaluation and management by telemedicine and the availability of in person appointments. The patient expressed understanding and agreed to proceed.  I provided 17 minutes of non-face-to-face time during this encounter.     09/04/2020 3:04 PM Kelly Pace  MRN:  [id number]  Chief Complaint:  " I can not sleep at night for the past week or so.""  HPI: Patient reported that she was doing well overall however for the past week she has noticed that she is not able to sleep well.  She stated that the trazodone she has a hard time falling asleep and then eventually when she does fall asleep she wakes up with her heart beating very fast and this has been ongoing for the past week.  She denied having any palpitations in any other context or setting like during the daytime.  She stated that she has not noticed any significant increase in her stress.  She is not sure why this is happening now.  She informed that she is to take melatonin for sleep in the past and eventually regarding her immune to it and it does not work anymore.  She stated that she cannot sleep and as result he feels tired during the daytime the next day. Patient does not want to try higher dose of trazodone due to the palpitations.  Patient was offered trial of doxepin to see if that would help her better with sleep.   Visit Diagnosis:    ICD-10-CM   1. Bipolar 2 disorder (HCC)  F31.81 doxepin (SINEQUAN) 25 MG capsule    lamoTRIgine (LAMICTAL) 25 MG tablet    sertraline (ZOLOFT) 25 MG tablet    Past Psychiatric History: recently diagnosed Bipolar 2 d/o, anxiety.  Past Medical History:  Past Medical History:   Diagnosis Date  . Asthma   . Eczema   . Wears contact lenses     Past Surgical History:  Procedure Laterality Date  . NO PAST SURGERIES    . TONSILLECTOMY N/A 08/31/2019   Procedure: TONSILLECTOMY;  Surgeon: Bud Face, MD;  Location: [facility];  Service: ENT;  Laterality: N/A;    Family Psychiatric History: denied  Family History: History reviewed. No pertinent family history.  Social History:  Social History   Socioeconomic History  . Marital status: Single    Spouse name: Not on file  . Number of children: Not on file  . Years of education: Not on file  . Highest education level: Not on file  Occupational History  . Not on file  Tobacco Use  . Smoking status: Former Games developer  . Smokeless tobacco: Never Used  . Tobacco comment: socially in HS  Vaping Use  . Vaping Use: Never used  Substance and Sexual Activity  . Alcohol use: Never  . Drug use: Not on file  . Sexual activity: Not on file  Other Topics Concern  . Not on file  Social History Narrative  . Not on file   Social Determinants of Health   Financial Resource Strain:   . Difficulty of Paying Living Expenses: Not on file  Food Insecurity:   . Worried About Programme researcher, broadcasting/film/video in the Last  Year: Not on file  . Ran Out of Food in the Last Year: Not on file  Transportation Needs:   . Lack of Transportation (Medical): Not on file  . Lack of Transportation (Non-Medical): Not on file  Physical Activity:   . Days of Exercise per Week: Not on file  . Minutes of Exercise per Session: Not on file  Stress:   . Feeling of Stress : Not on file  Social Connections:   . Frequency of Communication with Friends and Family: Not on file  . Frequency of Social Gatherings with Friends and Family: Not on file  . Attends Religious Services: Not on file  . Active Member of Clubs or Organizations: Not on file  . Attends Banker Meetings: Not on file  . Marital Status: Not on file     Allergies:  Allergies  Allergen Reactions  . Cashew Nut Oil Anaphylaxis  . Fish Allergy Anaphylaxis    ALL seafood  . Watermelon [Citrullus Vulgaris] Itching    tongue  . Nickel Rash    Metabolic Disorder Labs: No results found for: HGBA1C, MPG No results found for: PROLACTIN No results found for: CHOL, TRIG, HDL, CHOLHDL, VLDL, LDLCALC No results found for: TSH  Therapeutic Level Labs: No results found for: LITHIUM No results found for: VALPROATE No components found for:  CBMZ  Current Medications: Current Outpatient Medications  Medication Sig Dispense Refill  . doxepin (SINEQUAN) 25 MG capsule Take one to two capsules at bedtime as needed for sleep 60 capsule 1  . etonogestrel (NEXPLANON) 68 MG IMPL implant 1 each by Subdermal route once.    . lamoTRIgine (LAMICTAL) 25 MG tablet Take 2 tablets daily 60 tablet 1  . sertraline (ZOLOFT) 25 MG tablet Take 1 tablet (25 mg total) by mouth daily. 30 tablet 1   No current facility-administered medications for this visit.     Psychiatric Specialty Exam: Review of Systems  There were no vitals taken for this visit.There is no height or weight on file to calculate BMI.  General Appearance: Well Groomed, wearing work uniform  Eye Contact:  Good  Speech:  Clear and Coherent and Normal Rate  Volume:  Normal  Mood:  Euthymic  Affect:  Congruent  Thought Process:  Goal Directed and Descriptions of Associations: Intact  Orientation:  Full (Time, Place, and Person)  Thought Content: Logical   Suicidal Thoughts:  No  Homicidal Thoughts:  No  Memory:  Immediate;   Good Recent;   Good  Judgement:  Fair  Insight:  Fair  Psychomotor Activity:  Normal  Concentration:  Concentration: Good and Attention Span: Good  Recall:  Good  Fund of Knowledge: Good  Language: Good  Akathisia:  Negative  Handed:  Right  AIMS (if indicated): not done  Assets:  Communication Skills Desire for Improvement Financial  Resources/Insurance Housing Social Support  ADL's:  Intact  Cognition: WNL  Sleep:  Poor    Assessment and Plan: Patient reported difficulty in staying asleep and also reported some palpitations at nighttime.  She was offered trial of doxepin to see if that will help her with sleep better. Potential side effects of medication and risks vs benefits of treatment vs non-treatment were explained and discussed. All questions were answered.   1. Bipolar 2 disorder (HCC)  - Start doxepin (SINEQUAN) 25 MG capsule; Take one to two capsules at bedtime as needed for sleep  Dispense: 60 capsule; Refill: 1 - lamoTRIgine (LAMICTAL) 25 MG tablet; Take 2  tablets daily  Dispense: 60 tablet; Refill: 1 - sertraline (ZOLOFT) 25 MG tablet; Take 1 tablet (25 mg total) by mouth daily.  Dispense: 30 tablet; Refill: 1 - Discontinue Trazodone due to lack of efficacy.  Follow-up in 6 weeks.    Zena Amos, MD 09/04/2020, 3:04 PM

## 2020-09-27 ENCOUNTER — Other Ambulatory Visit (HOSPITAL_COMMUNITY): Payer: Self-pay | Admitting: Psychiatry

## 2020-09-27 DIAGNOSIS — F3181 Bipolar II disorder: Secondary | ICD-10-CM

## 2020-10-08 ENCOUNTER — Encounter (HOSPITAL_COMMUNITY): Payer: Self-pay | Admitting: Psychiatry

## 2020-10-08 ENCOUNTER — Other Ambulatory Visit: Payer: Self-pay

## 2020-10-08 ENCOUNTER — Telehealth (INDEPENDENT_AMBULATORY_CARE_PROVIDER_SITE_OTHER): Payer: BC Managed Care – PPO | Admitting: Psychiatry

## 2020-10-08 DIAGNOSIS — F3181 Bipolar II disorder: Secondary | ICD-10-CM | POA: Diagnosis not present

## 2020-10-08 MED ORDER — LAMOTRIGINE 25 MG PO TABS: ORAL_TABLET | ORAL | 1 refills | Status: DC

## 2020-10-08 MED ORDER — SERTRALINE HCL 25 MG PO TABS: 25.0000 mg | ORAL_TABLET | Freq: Every day | ORAL | 1 refills | Status: DC

## 2020-10-08 MED ORDER — DOXEPIN HCL 50 MG PO CAPS: 50.0000 mg | ORAL_CAPSULE | Freq: Every evening | ORAL | 1 refills | Status: DC | PRN

## 2020-10-08 NOTE — Progress Notes (Signed)
BH MD/PA/NP OP Progress Note  Virtual Visit via Video Note  I connected with Kelly Pace on 10/08/20 at  3:20 PM EST by a video enabled telemedicine application and verified that I am speaking with the correct person using two identifiers.  Location: Patient: Home Provider: Clinic   I discussed the limitations of evaluation and management by telemedicine and the availability of in person appointments. The patient expressed understanding and agreed to proceed.  I provided 17 minutes of non-face-to-face time during this encounter.     10/08/2020 3:37 PM Kelly Pace  MRN:  [id number]  Chief Complaint:  " I am sleeping better with the Doxepin."  HPI: Patient contacted today for follow-up evaluation for medication management.  Patient reports that her current medication regimen has been effective in managing her symptoms.  She reported that she does not take doxepin every night because sometimes when she cannot finish her regular hours of sleep and if she takes doxepin on those nights she feels drowsy the next day.  She stated that she has found 2 capsules of doxepin to be more effective than 1 and therefore she takes 2 as needed. Patient denies any depressive symptoms and she reports that things are going well for her.  She reports that her mood has been stable lately. Patient denies any frequent fluctuations in her mood or hypomania symptoms.  Patient is agreeable to continue her current medication regimen at this time.   Patient was noted to be at work during the evaluation.  Patient reports that work is also going well and she is looking forward to the upcoming holidays.   Visit Diagnosis:    ICD-10-CM   1. Bipolar 2 disorder (HCC)  F31.81 lamoTRIgine (LAMICTAL) 25 MG tablet    sertraline (ZOLOFT) 25 MG tablet    doxepin (SINEQUAN) 50 MG capsule    Past Psychiatric History: recently diagnosed Bipolar 2 d/o, anxiety.  Past Medical History:  Past Medical History:  Diagnosis  Date  . Asthma   . Eczema   . Wears contact lenses     Past Surgical History:  Procedure Laterality Date  . NO PAST SURGERIES    . TONSILLECTOMY N/A 08/31/2019   Procedure: TONSILLECTOMY;  Surgeon: Bud Face, MD;  Location: [facility];  Service: ENT;  Laterality: N/A;    Family Psychiatric History: denied  Family History: History reviewed. No pertinent family history.  Social History:  Social History   Socioeconomic History  . Marital status: Single    Spouse name: Not on file  . Number of children: Not on file  . Years of education: Not on file  . Highest education level: Not on file  Occupational History  . Not on file  Tobacco Use  . Smoking status: Former Games developer  . Smokeless tobacco: Never Used  . Tobacco comment: socially in HS  Vaping Use  . Vaping Use: Never used  Substance and Sexual Activity  . Alcohol use: Never  . Drug use: Not on file  . Sexual activity: Not on file  Other Topics Concern  . Not on file  Social History Narrative  . Not on file   Social Determinants of Health   Financial Resource Strain:   . Difficulty of Paying Living Expenses: Not on file  Food Insecurity:   . Worried About Programme researcher, broadcasting/film/video in the Last Year: Not on file  . Ran Out of Food in the Last Year: Not on file  Transportation Needs:   .  Lack of Transportation (Medical): Not on file  . Lack of Transportation (Non-Medical): Not on file  Physical Activity:   . Days of Exercise per Week: Not on file  . Minutes of Exercise per Session: Not on file  Stress:   . Feeling of Stress : Not on file  Social Connections:   . Frequency of Communication with Friends and Family: Not on file  . Frequency of Social Gatherings with Friends and Family: Not on file  . Attends Religious Services: Not on file  . Active Member of Clubs or Organizations: Not on file  . Attends Banker Meetings: Not on file  . Marital Status: Not on file    Allergies:   Allergies  Allergen Reactions  . Cashew Nut Oil Anaphylaxis  . Fish Allergy Anaphylaxis    ALL seafood  . Watermelon [Citrullus Vulgaris] Itching    tongue  . Nickel Rash    Metabolic Disorder Labs: No results found for: HGBA1C, MPG No results found for: PROLACTIN No results found for: CHOL, TRIG, HDL, CHOLHDL, VLDL, LDLCALC No results found for: TSH  Therapeutic Level Labs: No results found for: LITHIUM No results found for: VALPROATE No components found for:  CBMZ  Current Medications: Current Outpatient Medications  Medication Sig Dispense Refill  . doxepin (SINEQUAN) 50 MG capsule Take 1 capsule (50 mg total) by mouth at bedtime as needed. 30 capsule 1  . etonogestrel (NEXPLANON) 68 MG IMPL implant 1 each by Subdermal route once.    . lamoTRIgine (LAMICTAL) 25 MG tablet Take 2 tablets daily 60 tablet 1  . sertraline (ZOLOFT) 25 MG tablet Take 1 tablet (25 mg total) by mouth daily. 30 tablet 1   No current facility-administered medications for this visit.     Psychiatric Specialty Exam: Review of Systems   There were no vitals taken for this visit.There is no height or weight on file to calculate BMI.  General Appearance: Well Groomed, wearing work uniform  Eye Contact:  Good  Speech:  Clear and Coherent and Normal Rate  Volume:  Normal  Mood:  Euthymic  Affect:  Congruent  Thought Process:  Goal Directed and Descriptions of Associations: Intact  Orientation:  Full (Time, Place, and Person)  Thought Content: Logical   Suicidal Thoughts:  No  Homicidal Thoughts:  No  Memory:  Immediate;   Good Recent;   Good  Judgement:  Fair  Insight:  Fair  Psychomotor Activity:  Normal  Concentration:  Concentration: Good and Attention Span: Good  Recall:  Good  Fund of Knowledge: Good  Language: Good  Akathisia:  Negative  Handed:  Right  AIMS (if indicated): not done  Assets:  Communication Skills Desire for Improvement Financial  Resources/Insurance Housing Social Support  ADL's:  Intact  Cognition: WNL  Sleep:  Good with help of Doxepin    Assessment and Plan: Patient appears to be doing well on her current regimen and will continue same regimen for now.  1. Bipolar 2 disorder (HCC)  - lamoTRIgine (LAMICTAL) 25 MG tablet; Take 2 tablets daily  Dispense: 60 tablet; Refill: 1 - sertraline (ZOLOFT) 25 MG tablet; Take 1 tablet (25 mg total) by mouth daily.  Dispense: 30 tablet; Refill: 1 - doxepin (SINEQUAN) 50 MG capsule; Take 1 capsule (50 mg total) by mouth at bedtime as needed.  Dispense: 30 capsule; Refill: 1  F/up in 3 months.  Zena Amos, MD 10/08/2020, 3:37 PM

## 2020-10-22 ENCOUNTER — Other Ambulatory Visit (HOSPITAL_COMMUNITY): Payer: Self-pay | Admitting: Psychiatry

## 2020-10-22 DIAGNOSIS — F3181 Bipolar II disorder: Secondary | ICD-10-CM

## 2020-10-24 DIAGNOSIS — R059 Cough, unspecified: Secondary | ICD-10-CM | POA: Diagnosis not present

## 2020-10-24 DIAGNOSIS — F3181 Bipolar II disorder: Secondary | ICD-10-CM | POA: Diagnosis not present

## 2020-10-24 DIAGNOSIS — R197 Diarrhea, unspecified: Secondary | ICD-10-CM | POA: Diagnosis not present

## 2020-10-24 DIAGNOSIS — Z20822 Contact with and (suspected) exposure to covid-19: Secondary | ICD-10-CM | POA: Diagnosis not present

## 2020-10-24 DIAGNOSIS — J029 Acute pharyngitis, unspecified: Secondary | ICD-10-CM | POA: Diagnosis not present

## 2020-10-24 DIAGNOSIS — R112 Nausea with vomiting, unspecified: Secondary | ICD-10-CM | POA: Diagnosis not present

## 2020-11-28 DIAGNOSIS — Z3042 Encounter for surveillance of injectable contraceptive: Secondary | ICD-10-CM | POA: Diagnosis not present

## 2020-11-28 DIAGNOSIS — Z23 Encounter for immunization: Secondary | ICD-10-CM | POA: Diagnosis not present

## 2020-12-18 ENCOUNTER — Ambulatory Visit: Payer: BC Managed Care – PPO | Admitting: Family Medicine

## 2020-12-18 DIAGNOSIS — Z0289 Encounter for other administrative examinations: Secondary | ICD-10-CM

## 2021-01-03 ENCOUNTER — Other Ambulatory Visit: Payer: Self-pay

## 2021-01-03 ENCOUNTER — Telehealth (INDEPENDENT_AMBULATORY_CARE_PROVIDER_SITE_OTHER): Payer: BC Managed Care – PPO | Admitting: Psychiatry

## 2021-01-03 ENCOUNTER — Encounter (HOSPITAL_COMMUNITY): Payer: Self-pay | Admitting: Psychiatry

## 2021-01-03 DIAGNOSIS — F3181 Bipolar II disorder: Secondary | ICD-10-CM

## 2021-01-03 MED ORDER — DOXEPIN HCL 50 MG PO CAPS: 50.0000 mg | ORAL_CAPSULE | Freq: Every evening | ORAL | 0 refills | Status: DC | PRN

## 2021-01-03 MED ORDER — LAMOTRIGINE 25 MG PO TABS: ORAL_TABLET | ORAL | 0 refills | Status: DC

## 2021-01-03 MED ORDER — SERTRALINE HCL 25 MG PO TABS: 25.0000 mg | ORAL_TABLET | Freq: Every day | ORAL | 0 refills | Status: DC

## 2021-01-03 NOTE — Progress Notes (Signed)
BH MD/PA/NP OP Progress Note  Virtual Visit via Video Note  I connected with Kelly Pace on 01/03/21 at  3:00 PM EST by a video enabled telemedicine application and verified that I am speaking with the correct person using two identifiers.  Location: Patient: Home Provider: Clinic   I discussed the limitations of evaluation and management by telemedicine and the availability of in person appointments. The patient expressed understanding and agreed to proceed.  I provided 14 minutes of non-face-to-face time during this encounter.      01/03/2021 3:00 PM Kelly Pace  MRN:  [id number]  Chief Complaint:  " Everything is going well."  HPI: Patient reported she is doing well. She informed that she is no longer working for [employer] and is now working at [employer]. She is also starting another new job at [employer] to see which one of the job options that she prefer. She will see which one works better for her and then will leave the other job. She informed that because her work schedule is different now she goes to bed a little bit late however whenever she takes doxepin she is able to go to sleep. She informed that she had a very hectic day this past Monday but other than that her mood has been stable and things have been going well for her. She denied any other concerns at this time.  Visit Diagnosis:    ICD-10-CM   1. Bipolar 2 disorder (HCC)  F31.81     Past Psychiatric History: recently diagnosed Bipolar 2 d/o, anxiety.  Past Medical History:  Past Medical History:  Diagnosis Date  . Asthma   . Eczema   . Wears contact lenses     Past Surgical History:  Procedure Laterality Date  . NO PAST SURGERIES    . TONSILLECTOMY N/A 08/31/2019   Procedure: TONSILLECTOMY;  Surgeon: Bud Face, MD;  Location: [facility];  Service: ENT;  Laterality: N/A;    Family Psychiatric History: denied  Family History: No family history on file.  Social History:   Social History   Socioeconomic History  . Marital status: Single    Spouse name: Not on file  . Number of children: Not on file  . Years of education: Not on file  . Highest education level: Not on file  Occupational History  . Not on file  Tobacco Use  . Smoking status: Former Games developer  . Smokeless tobacco: Never Used  . Tobacco comment: socially in HS  Vaping Use  . Vaping Use: Never used  Substance and Sexual Activity  . Alcohol use: Never  . Drug use: Not on file  . Sexual activity: Not on file  Other Topics Concern  . Not on file  Social History Narrative  . Not on file   Social Determinants of Health   Financial Resource Strain: Not on file  Food Insecurity: Not on file  Transportation Needs: Not on file  Physical Activity: Not on file  Stress: Not on file  Social Connections: Not on file    Allergies:  Allergies  Allergen Reactions  . Cashew Nut Oil Anaphylaxis  . Fish Allergy Anaphylaxis    ALL seafood  . Watermelon [Citrullus Vulgaris] Itching    tongue  . Nickel Rash    Metabolic Disorder Labs: No results found for: HGBA1C, MPG No results found for: PROLACTIN No results found for: CHOL, TRIG, HDL, CHOLHDL, VLDL, LDLCALC No results found for: TSH  Therapeutic Level Labs: No results  found for: LITHIUM No results found for: VALPROATE No components found for:  CBMZ  Current Medications: Current Outpatient Medications  Medication Sig Dispense Refill  . doxepin (SINEQUAN) 50 MG capsule TAKE 1 CAPSULE (50 MG TOTAL) BY MOUTH AT BEDTIME AS NEEDED. 90 capsule 1  . etonogestrel (NEXPLANON) 68 MG IMPL implant 1 each by Subdermal route once.    . lamoTRIgine (LAMICTAL) 25 MG tablet Take 2 tablets daily 60 tablet 1  . sertraline (ZOLOFT) 25 MG tablet Take 1 tablet (25 mg total) by mouth daily. 30 tablet 1   No current facility-administered medications for this visit.     Psychiatric Specialty Exam: Review of Systems   There were no vitals taken for  this visit.There is no height or weight on file to calculate BMI.  General Appearance: Well Groomed  Eye Contact:  Good  Speech:  Clear and Coherent and Normal Rate  Volume:  Normal  Mood:  Euthymic  Affect:  Congruent  Thought Process:  Goal Directed and Descriptions of Associations: Intact  Orientation:  Full (Time, Place, and Person)  Thought Content: Logical   Suicidal Thoughts:  No  Homicidal Thoughts:  No  Memory:  Immediate;   Good Recent;   Good  Judgement:  Fair  Insight:  Fair  Psychomotor Activity:  Normal  Concentration:  Concentration: Good and Attention Span: Good  Recall:  Good  Fund of Knowledge: Good  Language: Good  Akathisia:  Negative  Handed:  Right  AIMS (if indicated): not done  Assets:  Communication Skills Desire for Improvement Financial Resources/Insurance Housing Social Support  ADL's:  Intact  Cognition: WNL  Sleep:  Good with help of Doxepin   Assessment and Plan: Patient appears to be stable on her current regimen.    1. Bipolar 2 disorder (HCC)  - doxepin (SINEQUAN) 50 MG capsule; Take 1 capsule (50 mg total) by mouth at bedtime as needed.  Dispense: 90 capsule; Refill: 0 - lamoTRIgine (LAMICTAL) 25 MG tablet; Take 2 tablets daily  Dispense: 180 tablet; Refill: 0 - sertraline (ZOLOFT) 25 MG tablet; Take 1 tablet (25 mg total) by mouth daily.  Dispense: 90 tablet; Refill: 0  Continue same medication regimen. Follow up in 3 months.   Zena Amos, MD 01/03/2021, 3:00 PM

## 2021-01-17 DIAGNOSIS — J4521 Mild intermittent asthma with (acute) exacerbation: Secondary | ICD-10-CM | POA: Diagnosis not present

## 2021-01-18 DIAGNOSIS — J9801 Acute bronchospasm: Secondary | ICD-10-CM | POA: Diagnosis not present

## 2021-01-18 DIAGNOSIS — G4452 New daily persistent headache (NDPH): Secondary | ICD-10-CM | POA: Diagnosis not present

## 2021-01-18 DIAGNOSIS — Z6835 Body mass index (BMI) 35.0-35.9, adult: Secondary | ICD-10-CM | POA: Diagnosis not present

## 2021-01-18 DIAGNOSIS — R0602 Shortness of breath: Secondary | ICD-10-CM | POA: Diagnosis not present

## 2021-01-18 DIAGNOSIS — J45909 Unspecified asthma, uncomplicated: Secondary | ICD-10-CM | POA: Diagnosis not present

## 2021-01-18 DIAGNOSIS — R519 Headache, unspecified: Secondary | ICD-10-CM | POA: Diagnosis not present

## 2021-01-19 DIAGNOSIS — G4452 New daily persistent headache (NDPH): Secondary | ICD-10-CM | POA: Diagnosis not present

## 2021-01-29 ENCOUNTER — Other Ambulatory Visit: Payer: Self-pay | Admitting: Pediatrics

## 2021-01-29 ENCOUNTER — Ambulatory Visit
Admission: RE | Admit: 2021-01-29 | Discharge: 2021-01-29 | Disposition: A | Discharge status: Home / Self Care | Payer: BC Managed Care – PPO | Source: Ambulatory Visit | Attending: Pediatrics | Admitting: Pediatrics

## 2021-01-29 DIAGNOSIS — Z0001 Encounter for general adult medical examination with abnormal findings: Secondary | ICD-10-CM | POA: Diagnosis not present

## 2021-01-29 DIAGNOSIS — R059 Cough, unspecified: Secondary | ICD-10-CM | POA: Diagnosis not present

## 2021-01-29 DIAGNOSIS — Z113 Encounter for screening for infections with a predominantly sexual mode of transmission: Secondary | ICD-10-CM | POA: Diagnosis not present

## 2021-01-29 DIAGNOSIS — R0600 Dyspnea, unspecified: Secondary | ICD-10-CM | POA: Insufficient documentation

## 2021-01-29 DIAGNOSIS — Z713 Dietary counseling and surveillance: Secondary | ICD-10-CM | POA: Diagnosis not present

## 2021-01-29 DIAGNOSIS — R0789 Other chest pain: Secondary | ICD-10-CM | POA: Diagnosis not present

## 2021-01-29 DIAGNOSIS — Z6834 Body mass index (BMI) 34.0-34.9, adult: Secondary | ICD-10-CM | POA: Diagnosis not present

## 2021-01-29 DIAGNOSIS — J4521 Mild intermittent asthma with (acute) exacerbation: Secondary | ICD-10-CM | POA: Diagnosis not present

## 2021-01-31 DIAGNOSIS — Z3046 Encounter for surveillance of implantable subdermal contraceptive: Secondary | ICD-10-CM | POA: Diagnosis not present

## 2021-03-28 ENCOUNTER — Encounter (HOSPITAL_COMMUNITY): Payer: Self-pay | Admitting: Psychiatry

## 2021-03-28 ENCOUNTER — Telehealth (INDEPENDENT_AMBULATORY_CARE_PROVIDER_SITE_OTHER): Payer: BC Managed Care – PPO | Admitting: Psychiatry

## 2021-03-28 ENCOUNTER — Other Ambulatory Visit: Payer: Self-pay

## 2021-03-28 DIAGNOSIS — F3181 Bipolar II disorder: Secondary | ICD-10-CM

## 2021-03-28 MED ORDER — LAMOTRIGINE 25 MG PO TABS: ORAL_TABLET | ORAL | 0 refills | Status: DC

## 2021-03-28 MED ORDER — TRAZODONE HCL 50 MG PO TABS: 50.0000 mg | ORAL_TABLET | Freq: Every evening | ORAL | 0 refills | Status: DC | PRN

## 2021-03-28 MED ORDER — SERTRALINE HCL 25 MG PO TABS
25.0000 mg | ORAL_TABLET | Freq: Every day | ORAL | 0 refills | Status: DC
Start: 2021-03-28 — End: 2021-06-25

## 2021-03-28 NOTE — Progress Notes (Signed)
BH MD/PA/NP OP Progress Note  Virtual Visit via Video Note  I connected with Kelly Pace on 03/28/21 at  3:20 PM EDT by a video enabled telemedicine application and verified that I am speaking with the correct person using two identifiers.  Location: Patient: Home Provider: Clinic   I discussed the limitations of evaluation and management by telemedicine and the availability of in person appointments. The patient expressed understanding and agreed to proceed.  I provided 13 minutes of non-face-to-face time during this encounter.        03/28/2021 3:24 PM Kelly Pace  MRN:  [id number]  Chief Complaint:  " I am doing okay now."  HPI: Patient reported that she was doing well however a few weeks ago when she went to [place] she had a very hard time sleeping.  She had to start melatonin along with the doxepin but even that was not effective. She stated that when she returned back home she decided to restart taking trazodone which she had taken in the past.  She stated that trazodone has been helpful with her sleep and she would like to continue taking that. She stated that she is now working in the HR department at [employer] and is no longer with [employer].  She informed that she is also attending online classes from [employer] along with a full-time job. She stated that her mood has been stable and denied any other concerns.  Visit Diagnosis:    ICD-10-CM   1. Bipolar 2 disorder (HCC)  F31.81 traZODone (DESYREL) 50 MG tablet    lamoTRIgine (LAMICTAL) 25 MG tablet    sertraline (ZOLOFT) 25 MG tablet    Past Psychiatric History: recently diagnosed Bipolar 2 d/o, anxiety.  Past Medical History:  Past Medical History:  Diagnosis Date  . Asthma   . Eczema   . Wears contact lenses     Past Surgical History:  Procedure Laterality Date  . NO PAST SURGERIES    . TONSILLECTOMY N/A 08/31/2019   Procedure: TONSILLECTOMY;  Surgeon: Bud Face, MD;   Location: [facility];  Service: ENT;  Laterality: N/A;    Family Psychiatric History: denied  Family History: No family history on file.  Social History:  Social History   Socioeconomic History  . Marital status: Single    Spouse name: Not on file  . Number of children: Not on file  . Years of education: Not on file  . Highest education level: Not on file  Occupational History  . Not on file  Tobacco Use  . Smoking status: Former Games developer  . Smokeless tobacco: Never Used  . Tobacco comment: socially in HS  Vaping Use  . Vaping Use: Never used  Substance and Sexual Activity  . Alcohol use: Never  . Drug use: Not on file  . Sexual activity: Not on file  Other Topics Concern  . Not on file  Social History Narrative  . Not on file   Social Determinants of Health   Financial Resource Strain: Not on file  Food Insecurity: Not on file  Transportation Needs: Not on file  Physical Activity: Not on file  Stress: Not on file  Social Connections: Not on file    Allergies:  Allergies  Allergen Reactions  . Cashew Nut Oil Anaphylaxis  . Fish Allergy Anaphylaxis    ALL seafood  . Watermelon [Citrullus Vulgaris] Itching    tongue  . Nickel Rash    Metabolic Disorder Labs: No results  found for: HGBA1C, MPG No results found for: PROLACTIN No results found for: CHOL, TRIG, HDL, CHOLHDL, VLDL, LDLCALC No results found for: TSH  Therapeutic Level Labs: No results found for: LITHIUM No results found for: VALPROATE No components found for:  CBMZ  Current Medications: Current Outpatient Medications  Medication Sig Dispense Refill  . traZODone (DESYREL) 50 MG tablet Take 1 tablet (50 mg total) by mouth at bedtime as needed for sleep. 90 tablet 0  . etonogestrel (NEXPLANON) 68 MG IMPL implant 1 each by Subdermal route once.    . lamoTRIgine (LAMICTAL) 25 MG tablet Take 2 tablets daily 180 tablet 0  . sertraline (ZOLOFT) 25 MG tablet Take 1 tablet (25 mg total) by  mouth daily. 90 tablet 0   No current facility-administered medications for this visit.     Psychiatric Specialty Exam: Review of Systems   There were no vitals taken for this visit.There is no height or weight on file to calculate BMI.  General Appearance: Well Groomed  Eye Contact:  Good  Speech:  Clear and Coherent and Normal Rate  Volume:  Normal  Mood:  Euthymic  Affect:  Congruent  Thought Process:  Goal Directed and Descriptions of Associations: Intact  Orientation:  Full (Time, Place, and Person)  Thought Content: Logical   Suicidal Thoughts:  No  Homicidal Thoughts:  No  Memory:  Immediate;   Good Recent;   Good  Judgement:  Fair  Insight:  Fair  Psychomotor Activity:  Normal  Concentration:  Concentration: Good and Attention Span: Good  Recall:  Good  Fund of Knowledge: Good  Language: Good  Akathisia:  Negative  Handed:  Right  AIMS (if indicated): not done  Assets:  Communication Skills Desire for Improvement Financial Resources/Insurance Housing Social Support  ADL's:  Intact  Cognition: WNL  Sleep:  Good    Assessment and Plan: Patient seems to be doing well for now.   1. Bipolar 2 disorder (HCC)  -Restart traZODone (DESYREL) 50 MG tablet; Take 1 tablet (50 mg total) by mouth at bedtime as needed for sleep.  Dispense: 90 tablet; Refill: 0 -Discontinue doxepin due to lack of efficacy. - lamoTRIgine (LAMICTAL) 25 MG tablet; Take 2 tablets daily  Dispense: 180 tablet; Refill: 0 - sertraline (ZOLOFT) 25 MG tablet; Take 1 tablet (25 mg total) by mouth daily.  Dispense: 90 tablet; Refill: 0   Continue same medication regimen. Follow up in 3 months.  Patient was informed that her care is being transferred to a different provider at [facility] psychiatry clinic.   Zena Amos, MD 03/28/2021, 3:24 PM

## 2021-06-19 NOTE — Progress Notes (Signed)
Virtual Visit via Video Note  I connected with Kelly Pace on 06/25/21 at  3:00 PM EDT by a video enabled telemedicine application and verified that I am speaking with the correct person using two identifiers.  Location: Patient: work Provider: office Persons participated in the visit- patient, provider    I discussed the limitations of evaluation and management by telemedicine and the availability of in person appointments. The patient expressed understanding and agreed to proceed.    I discussed the assessment and treatment plan with the patient. The patient was provided an opportunity to ask questions and all were answered. The patient agreed with the plan and demonstrated an understanding of the instructions.   The patient was advised to call back or seek an in-person evaluation if the symptoms worsen or if the condition fails to improve as anticipated.  I provided 26 minutes of non-face-to-face time during this encounter.   Neysa Hotter, MD   [facility] MD/PA/NP OP Progress Note  06/25/2021 3:48 PM Kelly Pace  MRN:  [id number]  Chief Complaint:  Chief Complaint   Follow-up; Other    HPI: Kelly Pace is a 20 y.o. year old female with a history of bipolar II disorder, who is transferred from Dr. Evelene Croon.   She states that she is feeling more anxious lately.  She woke up in the middle of the night with severe anxiety.  It occurred once a few months ago, and it has been occurring at least a few times for the past few weeks.  She believes that her "OCD" is bothering her.  She states that everything has to be in certain way.  She talks about an example of her needing to make sure shoes are facing a certain way.  It has been consuming for her.  She is counting alphabet.  She rubs fingers, nails.  She denies obsessive thoughts or fear of contamination.  She reports good relationship with her parents at home, who provides her good support.  She enjoys hanging out with her friends at times,  although she usually does not like going outside.  She thinks that she has been handling things well at work, and denies any concern.   Depression-she denies feeling depressed or anhedonia.  She has middle insomnia, daytime fatigue, and snoring at night.  She has fair energy.  She denies SI.   Mania-she reports history of mild euphonia/chatting after returned from work.  She tends to be irritable very easily.  She has impulsive shopping, although she denies any concern about its effect on finances.  She denies decreased need for sleep.  She has an uncle, who was diagnosed with bipolar disorder.  She denies hallucinations.   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 She had "normal life" and has "beautiful relationship" with her parents.   Visit Diagnosis:    ICD-10-CM   1. Insomnia, unspecified type  G47.00     2. Bipolar 2 disorder (HCC)  F31.81 traZODone (DESYREL) 50 MG tablet    lamoTRIgine (LAMICTAL) 25 MG tablet    sertraline (ZOLOFT) 50 MG tablet    TSH    3. Obsessive-compulsive disorder, unspecified type  F42.9 TSH  Past Psychiatric History:  Outpatient:  Psychiatry admission: denies Previous suicide attempt: denies Past trials of medication: sertraline, lamotrigine, trazodone, melatonin History of violence:  denies  Past Medical History:  Past Medical History:  Diagnosis Date   Asthma    Eczema    Wears contact lenses     Past Surgical History:  Procedure Laterality Date   NO PAST SURGERIES     TONSILLECTOMY N/A 08/31/2019   Procedure: TONSILLECTOMY;  Surgeon: Bud Face, MD;  Location: [facility];   Service: ENT;  Laterality: N/A;    Family Psychiatric History:  As below  Family History:  Family History  Problem Relation Age of Onset   Bipolar disorder Maternal Uncle     Social History:  Social History   Socioeconomic History   Marital status: Single    Spouse name: Not on file   Number of children: Not on file   Years of education: Not on file   Highest education level: Not on file  Occupational History   Not on file  Tobacco Use   Smoking status: Former   Smokeless tobacco: Never   Tobacco comments:    socially in HS  Vaping Use   Vaping Use: Never used  Substance and Sexual Activity   Alcohol use: Never   Drug use: Not on file   Sexual activity: Not on file  Other Topics Concern   Not on file  Social History Narrative   Not on file   Social Determinants of Health   Financial Resource Strain: Not on file  Food Insecurity: Not on file  Transportation Needs: Not on file  Physical Activity: Not on file  Stress: Not on file  Social Connections: Not on file    Allergies:  Allergies  Allergen Reactions   Cashew Nut Oil Anaphylaxis   Fish Allergy Anaphylaxis    ALL seafood   Watermelon [Citrullus Vulgaris] Itching    tongue   Nickel Rash    Metabolic Disorder Labs: No results found for: HGBA1C, MPG No results found for: PROLACTIN No results found for: CHOL, TRIG, HDL, CHOLHDL, VLDL, LDLCALC No results found for: TSH  Therapeutic Level Labs: No results found for: LITHIUM No results found for: VALPROATE No components found for:  CBMZ  Current Medications: Current Outpatient Medications  Medication Sig Dispense Refill   etonogestrel (NEXPLANON) 68 MG IMPL implant 1 each by Subdermal route once.     lamoTRIgine (LAMICTAL) 25 MG tablet Take 2 tablets (50 mg total) by mouth daily. Take 2 tablets daily 180 tablet 0   sertraline (ZOLOFT) 50 MG tablet Take 1 tablet (50 mg total) by mouth daily. 30 tablet 0   traZODone (DESYREL) 50 MG tablet Take 1  tablet (50 mg total) by mouth at bedtime as needed for sleep. 90 tablet 0   No current facility-administered medications for this visit.     Musculoskeletal: Strength & Muscle Tone:  N/A Gait & Station:  N/A Patient leans: N/A  Psychiatric Specialty Exam: Review of Systems  Psychiatric/Behavioral:  Positive for sleep disturbance. Negative for agitation, behavioral problems, confusion, decreased concentration, dysphoric mood, hallucinations, self-injury and suicidal ideas. The patient is nervous/anxious. The patient is not hyperactive.   All other systems reviewed and are negative.  There were no vitals taken for this visit.There is no height or weight on file to calculate BMI.  General Appearance: Fairly Groomed  Eye Contact:  Good  Speech:  Clear and Coherent  Volume:  Normal  Mood:  Anxious  Affect:  Appropriate, Congruent, and calm, but mildly anxious, biting her nails  Thought Process:  Coherent  Orientation:  Full (Time, Place, and Person)  Thought Content: Logical   Suicidal Thoughts:  No  Homicidal Thoughts:  No  Memory:  Immediate;   Good  Judgement:  Good  Insight:  Good  Psychomotor Activity:  Normal  Concentration:  Concentration: Good and Attention Span: Good  Recall:  Good  Fund of Knowledge: Good  Language: Good  Akathisia:  No  Handed:  Right  AIMS (if indicated): not done  Assets:  Communication Skills Desire for Improvement  ADL's:  Intact  Cognition: WNL  Sleep:  Poor   Screenings: PHQ2-9    Flowsheet Row Video Visit from 06/25/2021 in [facility] Video Visit from 03/28/2021 in [place]  PHQ-2 Total Score 1 0      Flowsheet Row Video Visit from 06/25/2021 in [facility] Video Visit from 03/28/2021 in [facility]  C-SSRS RISK CATEGORY No Risk No Risk        Assessment and Plan:  Alli C Manolis is a 20 y.o. year old female with a  history of bipolar II disorder, who is transferred from Dr. Evelene Croon.   1. Bipolar 2 disorder (HCC) 3. Obsessive-compulsive disorder, unspecified type She reports worsening in anxiety in relate to ego-dystonic obsessive compulsion (arranging things) since the last visit.  She denies any significant psychosocial stressors, and reports good relationship with her parents at home, and enjoys her work.  We uptitrate sertraline to optimize treatment for OCD and anxiety.  Discussed potential risk of medication induced mania.  Noted that she reports only subthreshold hypomanic symptoms, although she was diagnosed with bipolar 2 disorder by her previous provider; will continue to monitor.  Will continue lamotrigine for mood dysregulation.  Discussed potential risk of Stevens-Johnson syndrome.   2. Insomnia, unspecified type She reports middle insomnia, snoring, and daytime fatigue.  Will make referral for evaluation of sleep apnea.  Will continue trazodone as needed for insomnia.   Plan Increase sertraline 50 mg daily Continue lamotrigine 50 mg daily Continue trazodone 50 mg at night as needed for sleep Next appointment-September 8 at 830 for 30 minutes, video.  Referral for sleep evaluation Obtain labs/TSH to rule out medical condition contributing to her mood symptoms  The patient demonstrates the following risk factors for suicide: Chronic risk factors for suicide include: psychiatric disorder of depression, anxiety . Acute risk factors for suicide include: N/A. Protective factors for this patient include: positive social support, coping skills, and hope for the future. Considering these factors, the overall suicide risk at this point appears to be low. Patient is appropriate for outpatient follow up.        Neysa Hotter, MD 06/25/2021, 3:48 PM

## 2021-06-25 ENCOUNTER — Other Ambulatory Visit: Payer: Self-pay

## 2021-06-25 ENCOUNTER — Encounter: Payer: Self-pay | Admitting: Psychiatry

## 2021-06-25 ENCOUNTER — Telehealth (HOSPITAL_BASED_OUTPATIENT_CLINIC_OR_DEPARTMENT_OTHER): Payer: BC Managed Care – PPO | Admitting: Psychiatry

## 2021-06-25 DIAGNOSIS — F429 Obsessive-compulsive disorder, unspecified: Secondary | ICD-10-CM

## 2021-06-25 DIAGNOSIS — G4700 Insomnia, unspecified: Secondary | ICD-10-CM

## 2021-06-25 DIAGNOSIS — F3181 Bipolar II disorder: Secondary | ICD-10-CM | POA: Diagnosis not present

## 2021-06-25 MED ORDER — LAMOTRIGINE 25 MG PO TABS: 50.0000 mg | ORAL_TABLET | Freq: Every day | ORAL | 0 refills | Status: DC

## 2021-06-25 MED ORDER — TRAZODONE HCL 50 MG PO TABS
50.0000 mg | ORAL_TABLET | Freq: Every evening | ORAL | 0 refills | Status: DC | PRN
Start: 2021-06-25 — End: 2021-10-28

## 2021-06-25 MED ORDER — SERTRALINE HCL 50 MG PO TABS: 50.0000 mg | ORAL_TABLET | Freq: Every day | ORAL | 0 refills | Status: DC

## 2021-06-25 NOTE — Patient Instructions (Signed)
  Increase sertraline 50 mg daily Continue lamotrigine 50 mg daily Continue trazodone 50 mg at night as needed for sleep Next appointment-September 8 at 8:30, video.  Referral for sleep evaluation Obtain labs/TSH

## 2021-07-19 NOTE — Progress Notes (Deleted)
BH MD/PA/NP OP Progress Note  07/19/2021 11:12 AM Kelly Pace  MRN:  [id number]  Chief Complaint:  HPI: *** Visit Diagnosis: No diagnosis found.  Past Psychiatric History: Please see initial evaluation for full details. I have reviewed the history. No updates at this time.     Past Medical History:  Past Medical History:  Diagnosis Date   Asthma    Eczema    Wears contact lenses     Past Surgical History:  Procedure Laterality Date   NO PAST SURGERIES     TONSILLECTOMY N/A 08/31/2019   Procedure: TONSILLECTOMY;  Surgeon: Bud Face, MD;  Location: [facility];  Service: ENT;  Laterality: N/A;    Family Psychiatric History: Please see initial evaluation for full details. I have reviewed the history. No updates at this time.     Family History:  Family History  Problem Relation Age of Onset   Bipolar disorder Maternal Uncle     Social History:  Social History   Socioeconomic History   Marital status: Single    Spouse name: Not on file   Number of children: Not on file   Years of education: Not on file   Highest education level: Not on file  Occupational History   Not on file  Tobacco Use   Smoking status: Former   Smokeless tobacco: Never   Tobacco comments:    socially in HS  Vaping Use   Vaping Use: Never used  Substance and Sexual Activity   Alcohol use: Never   Drug use: Not on file   Sexual activity: Not on file  Other Topics Concern   Not on file  Social History Narrative   Not on file   Social Determinants of Health   Financial Resource Strain: Not on file  Food Insecurity: Not on file  Transportation Needs: Not on file  Physical Activity: Not on file  Stress: Not on file  Social Connections: Not on file    Allergies:  Allergies  Allergen Reactions   Cashew Nut Oil Anaphylaxis   Fish Allergy Anaphylaxis    ALL seafood   Watermelon [Citrullus Vulgaris] Itching    tongue   Nickel Rash    Metabolic Disorder Labs: No  results found for: HGBA1C, MPG No results found for: PROLACTIN No results found for: CHOL, TRIG, HDL, CHOLHDL, VLDL, LDLCALC No results found for: TSH  Therapeutic Level Labs: No results found for: LITHIUM No results found for: VALPROATE No components found for:  CBMZ  Current Medications: Current Outpatient Medications  Medication Sig Dispense Refill   etonogestrel (NEXPLANON) 68 MG IMPL implant 1 each by Subdermal route once.     lamoTRIgine (LAMICTAL) 25 MG tablet Take 2 tablets (50 mg total) by mouth daily. Take 2 tablets daily 180 tablet 0   sertraline (ZOLOFT) 50 MG tablet Take 1 tablet (50 mg total) by mouth daily. 30 tablet 0   traZODone (DESYREL) 50 MG tablet Take 1 tablet (50 mg total) by mouth at bedtime as needed for sleep. 90 tablet 0   No current facility-administered medications for this visit.     Musculoskeletal: Strength & Muscle Tone:  N/A Gait & Station:  N/A Patient leans: N/A  Psychiatric Specialty Exam: Review of Systems  There were no vitals taken for this visit.There is no height or weight on file to calculate BMI.  General Appearance: {Appearance:22683}  Eye Contact:  {BHH EYE CONTACT:22684}  Speech:  Clear and Coherent  Volume:  Normal  Mood:  {  BHH BOFB:51025}  Affect:  {Affect (PAA):22687}  Thought Process:  Coherent  Orientation:  Full (Time, Place, and Person)  Thought Content: Logical   Suicidal Thoughts:  {ST/HT (PAA):22692}  Homicidal Thoughts:  {ST/HT (PAA):22692}  Memory:  Immediate;   Good  Judgement:  {Judgement (PAA):22694}  Insight:  {Insight (PAA):22695}  Psychomotor Activity:  Normal  Concentration:  Concentration: Good and Attention Span: Good  Recall:  Good  Fund of Knowledge: Good  Language: Good  Akathisia:  No  Handed:  Right  AIMS (if indicated): not done  Assets:  Communication Skills Desire for Improvement  ADL's:  Intact  Cognition: WNL  Sleep:  {BHH GOOD/FAIR/POOR:22877}   Screenings: PHQ2-9    Flowsheet  Row Video Visit from 06/25/2021 in [facility] Video Visit from 03/28/2021 in [place]  PHQ-2 Total Score 1 0      Flowsheet Row Video Visit from 06/25/2021 in [facility] Video Visit from 03/28/2021 in [facility]  C-SSRS RISK CATEGORY No Risk No Risk        Assessment and Plan:   Kelly Pace is a 20 y.o. year old female with a history of bipolar II disorder, who is transferred from Dr. Evelene Croon.    1. Bipolar 2 disorder (HCC) 3. Obsessive-compulsive disorder, unspecified type She reports worsening in anxiety in relate to ego-dystonic obsessive compulsion (arranging things) since the last visit.  She denies any significant psychosocial stressors, and reports good relationship with her parents at home, and enjoys her work.  We uptitrate sertraline to optimize treatment for OCD and anxiety.  Discussed potential risk of medication induced mania.  Noted that she reports only subthreshold hypomanic symptoms, although she was diagnosed with bipolar 2 disorder by her previous provider; will continue to monitor.  Will continue lamotrigine for mood dysregulation.  Discussed potential risk of Stevens-Johnson syndrome.    2. Insomnia, unspecified type She reports middle insomnia, snoring, and daytime fatigue.  Will make referral for evaluation of sleep apnea.  Will continue trazodone as needed for insomnia.    Plan Increase sertraline 50 mg daily Continue lamotrigine 50 mg daily Continue trazodone 50 mg at night as needed for sleep Next appointment-September 8 at 830 for 30 minutes, video.  Referral for sleep evaluation Obtain labs/TSH to rule out medical condition contributing to her mood symptoms   The patient demonstrates the following risk factors for suicide: Chronic risk factors for suicide include: psychiatric disorder of depression, anxiety . Acute risk factors for suicide  include: N/A. Protective factors for this patient include: positive social support, coping skills, and hope for the future. Considering these factors, the overall suicide risk at this point appears to be low. Patient is appropriate for outpatient follow up.         Neysa Hotter, MD 07/19/2021, 11:12 AM

## 2021-07-25 ENCOUNTER — Telehealth: Payer: Self-pay | Admitting: Psychiatry

## 2021-07-25 ENCOUNTER — Other Ambulatory Visit: Payer: Self-pay

## 2021-07-25 NOTE — Telephone Encounter (Signed)
Sent link for video visit through Epic. Patient did not sign in. Called the patient for appointment scheduled today. The patient did not answer the phone. Left voice message to contact the office ([phone]).   ?

## 2021-08-03 ENCOUNTER — Other Ambulatory Visit (HOSPITAL_COMMUNITY): Payer: Self-pay | Admitting: Psychiatry

## 2021-08-03 DIAGNOSIS — F3181 Bipolar II disorder: Secondary | ICD-10-CM

## 2021-10-28 ENCOUNTER — Encounter: Payer: Self-pay | Admitting: Nurse Practitioner

## 2021-10-28 ENCOUNTER — Other Ambulatory Visit: Payer: Self-pay

## 2021-10-28 ENCOUNTER — Ambulatory Visit: Payer: BC Managed Care – PPO | Admitting: Nurse Practitioner

## 2021-10-28 VITALS — BP 108/69 | HR 77 | Temp 98.4°F | Ht 61.8 in | Wt 185.8 lb

## 2021-10-28 DIAGNOSIS — Z7689 Persons encountering health services in other specified circumstances: Secondary | ICD-10-CM

## 2021-10-28 DIAGNOSIS — R1084 Generalized abdominal pain: Secondary | ICD-10-CM | POA: Diagnosis not present

## 2021-10-28 DIAGNOSIS — R112 Nausea with vomiting, unspecified: Secondary | ICD-10-CM | POA: Diagnosis not present

## 2021-10-28 DIAGNOSIS — F3181 Bipolar II disorder: Secondary | ICD-10-CM

## 2021-10-28 DIAGNOSIS — Z23 Encounter for immunization: Secondary | ICD-10-CM

## 2021-10-28 DIAGNOSIS — R748 Abnormal levels of other serum enzymes: Secondary | ICD-10-CM

## 2021-10-28 DIAGNOSIS — Z136 Encounter for screening for cardiovascular disorders: Secondary | ICD-10-CM | POA: Diagnosis not present

## 2021-10-28 MED ORDER — SERTRALINE HCL 50 MG PO TABS: 50.0000 mg | ORAL_TABLET | Freq: Every day | ORAL | 1 refills | Status: DC

## 2021-10-28 MED ORDER — LAMOTRIGINE 25 MG PO TABS: 50.0000 mg | ORAL_TABLET | Freq: Every day | ORAL | 1 refills | Status: DC

## 2021-10-28 NOTE — Progress Notes (Signed)
BP 108/69   Pulse 77   Temp 98.4 F (36.9 C) (Oral)   Ht 5' 1.8" (1.57 m)   Wt 185 lb 12.8 oz (84.3 kg)   SpO2 98%   BMI 34.20 kg/m    Subjective:    Patient ID: Kelly Pace, female    DOB: 05-16-2001, 20 y.o.   MRN: [id number]  HPI: Kelly Pace is a 20 y.o. female  Chief Complaint  Patient presents with   Establish Care   Emesis    Pt states that every night she wakes up between 3 and 4 AM throwing up. States this happens to every night and has been going on for 4 to 5 months     Patient presents to clinic to establish care with new PCP.  Introduced to Designer, jewellery role and practice setting.  All questions answered.  Discussed provider/patient relationship and expectations.  Patient reports a history of Bipolar. Patient has been out of her medications since October. Patient states she was well controlled on the regimen prior to stopping them.  She stopped the medications because her provider left the practice.    Patient denies a history of: Hypertension, Elevated Cholesterol, Diabetes, Thyroid problems, Depression, Anxiety, Neurological problems, and Abdominal problems.   ABDOMINAL ISSUES Duration:  4-5 months  wakes up in the middle of the night and throws up. States she doesn't throw up food it is just like yellow spit. Nature:  nausea then heart is beating really fast.   Location: diffuse  Severity: 5/10 regularly then a 7/10 when it is really bad Radiation: no Episode duration: Frequency: constant but the bad pain is intermittent Alleviating factors: Aggravating factors: Treatments attempted: none Constipation: yes- sometimes she has diarrhea and sometimes she is constipated Diarrhea: yes Episodes of diarrhea/day: Mucous in the stool: no Heartburn: no Bloating:no Flatulence: yes Nausea: yes Vomiting: yes Episodes of vomit/day: 2-4 times per night Melena or hematochezia: no Rash: no Jaundice: no Fever: no Weight loss: yes- patient states she has  lost about 30 lbs     Active Ambulatory Problems    Diagnosis Date Noted   Bipolar 2 disorder ([place]) 03/19/2020   Generalized abdominal pain 10/28/2021   Resolved Ambulatory Problems    Diagnosis Date Noted   No Resolved Ambulatory Problems   Past Medical History:  Diagnosis Date   Anxiety    Asthma    Eczema    Wears contact lenses     Past Surgical History:  Procedure Laterality Date   NO PAST SURGERIES     TONSILLECTOMY N/A 08/31/2019   Procedure: TONSILLECTOMY;  Surgeon: Carloyn Manner, MD;  Location: [facility];  Service: ENT;  Laterality: N/A;   Family History  Problem Relation Age of Onset   Migraines Father    Bipolar disorder Maternal Uncle    Heart disease Paternal Aunt    Breast cancer Maternal Grandmother      Review of Systems  Constitutional:  Negative for fever.  Gastrointestinal:  Positive for abdominal pain, constipation, diarrhea, nausea and vomiting.   Per HPI unless specifically indicated above     Objective:    BP 108/69   Pulse 77   Temp 98.4 F (36.9 C) (Oral)   Ht 5' 1.8" (1.57 m)   Wt 185 lb 12.8 oz (84.3 kg)   SpO2 98%   BMI 34.20 kg/m   Wt Readings from Last 3 Encounters:  10/28/21 185 lb 12.8 oz (84.3 kg)  08/31/19 180 lb (81.6 kg)  06/11/18 173 lb (78.5 kg) (93 %, Z= 1.48)*   * Growth percentiles are based on CDC (Girls, 2-20 Years) data.    Physical Exam Vitals and nursing note reviewed.  Constitutional:      General: She is not in acute distress.    Appearance: Normal appearance. She is normal weight. She is not ill-appearing, toxic-appearing or diaphoretic.  HENT:     Head: Normocephalic.     Right Ear: External ear normal.     Left Ear: External ear normal.     Nose: Nose normal.     Mouth/Throat:     Mouth: Mucous membranes are moist.     Pharynx: Oropharynx is clear.  Eyes:     General:        Right eye: No discharge.        Left eye: No discharge.     Extraocular Movements: Extraocular  movements intact.     Conjunctiva/sclera: Conjunctivae normal.     Pupils: Pupils are equal, round, and reactive to light.  Cardiovascular:     Rate and Rhythm: Normal rate and regular rhythm.     Heart sounds: No murmur heard. Pulmonary:     Effort: Pulmonary effort is normal. No respiratory distress.     Breath sounds: Normal breath sounds. No wheezing or rales.  Abdominal:     General: Abdomen is flat. Bowel sounds are normal. There is no distension.     Palpations: Abdomen is soft.     Tenderness: There is abdominal tenderness. There is no right CVA tenderness, left CVA tenderness or guarding.  Musculoskeletal:     Cervical back: Normal range of motion and neck supple.  Skin:    General: Skin is warm and dry.     Capillary Refill: Capillary refill takes less than 2 seconds.  Neurological:     General: No focal deficit present.     Mental Status: She is alert and oriented to person, place, and time. Mental status is at baseline.  Psychiatric:        Mood and Affect: Mood normal.        Behavior: Behavior normal.        Thought Content: Thought content normal.        Judgment: Judgment normal.    Results for orders placed or performed during the hospital encounter of 08/31/19  Pregnancy, urine POC  Result Value Ref Range   Preg Test, Ur NEGATIVE NEGATIVE  Surgical pathology  Result Value Ref Range   SURGICAL PATHOLOGY      SURGICAL PATHOLOGY CASE: [id number] PATIENT: Kelly Pace Surgical Pathology Report     Specimen Submitted: A. Tonsil, right B. Tonsil, left  Clinical History: Chronic tonsillitis      DIAGNOSIS: A. TONSIL, RIGHT; TONSILLECTOMY: - REACTIVE LYMPHOID HYPERPLASIA. - NEGATIVE FOR MALIGNANCY.  B. TONSIL, LEFT; TONSILLECTOMY: - REACTIVE LYMPHOID HYPERPLASIA. - NEGATIVE FOR MALIGNANCY.   GROSS DESCRIPTION: A. Labeled: Right tonsil Received: In formalin Specimen: Tonsil Integrity: Intact Size: 4.0 x 2.0 x 1.7 cm External Surface:  Tan-pink, with cauterized base Cut Surface: Tan, fleshy, lobulated, and soft Adenoids: Not present Summary of Sections: 1- Representative sections  B. Labeled: Left tonsil Received: In formalin Specimen: Tonsil Integrity: Intact Size: 3.5 x 2.2 x 2.0 cm External Surface: Tan-pink, with cauterized base Cut Surface: Tan, fleshy, lobulated, and soft Adenoids: Not present Summary of Sections: 1- Representative sections   Final Diagnosis performed by Betsy Pries, MD.   Electronically signed 09/02/2019 8:40:12AM The electronic signature indicates  that the named Attending Pathologist has evaluated the specimen Technical component performed at [facility], [address] [place], [place] [address] Lab: [phone] Dir: Rush Farmer, MD, MMM  Professional component performed at [facility], [facility], [place], [place],  [address] Lab: [phone] Dir: Dellia Nims. Reuel Derby, MD       Assessment & Plan:   Problem List Items Addressed This Visit       Other   Bipolar 2 disorder ([place]) - Primary    Chronic. Has been off medication since October.  Will restart medications during visit.  Follow up in 3 months for reevaluation.       Relevant Medications   lamoTRIgine (LAMICTAL) 25 MG tablet   sertraline (ZOLOFT) 50 MG tablet   Generalized abdominal pain    Chronic for the last 4-5 months. Wakes up in the middle of the night with nausea, abdominal pain, vomiting and diarrhea.  Will draw lab work including hepatitis panel, LFTs, CBC, and CMP. Will likely order [place] of liver/gall bladder if blood work is negative. Referral placed for patient to see GI.  Will make recommendations based on lab results.       Relevant Medications   lamoTRIgine (LAMICTAL) 25 MG tablet   sertraline (ZOLOFT) 50 MG tablet   Other Relevant Orders   Comp Met (CMET)   CBC w/Diff   Hepatic function panel   Lipid Profile   H. pylori antigen, stool   Ambulatory referral to  Gastroenterology   Acute Viral Hepatitis (HAV, HBV, HCV)   Other Visit Diagnoses     Need for influenza vaccination       Relevant Orders   Flu Vaccine QUAD 25moIM (Fluarix, Fluzone & Alfiuria Quad PF) (Completed)   Nausea and vomiting, unspecified vomiting type       Relevant Medications   lamoTRIgine (LAMICTAL) 25 MG tablet   sertraline (ZOLOFT) 50 MG tablet   Other Relevant Orders   Comp Met (CMET)   CBC w/Diff   Hepatic function panel   Lipid Profile   H. pylori antigen, stool   Ambulatory referral to Gastroenterology   Acute Viral Hepatitis (HAV, HBV, HCV)   Encounter to establish care            Follow up plan: Return in about 3 months (around 01/26/2022) for Physical and Fasting labs.

## 2021-10-28 NOTE — Assessment & Plan Note (Signed)
Chronic. Has been off medication since October.  Will restart medications during visit.  Follow up in 3 months for reevaluation.

## 2021-10-28 NOTE — Assessment & Plan Note (Signed)
Chronic for the last 4-5 months. Wakes up in the middle of the night with nausea, abdominal pain, vomiting and diarrhea.  Will draw lab work including hepatitis panel, LFTs, CBC, and CMP. Will likely order [place] of liver/gall bladder if blood work is negative. Referral placed for patient to see GI.  Will make recommendations based on lab results.

## 2021-10-29 LAB — CBC WITH DIFFERENTIAL/PLATELET
Basophils Absolute: 0.1 10*3/uL (ref 0.0–0.2)
Basos: 0 %
EOS (ABSOLUTE): 0.3 10*3/uL (ref 0.0–0.4)
Eos: 2 %
Hematocrit: 39 % (ref 34.0–46.6)
Hemoglobin: 13.1 g/dL (ref 11.1–15.9)
Immature Grans (Abs): 0 10*3/uL (ref 0.0–0.1)
Immature Granulocytes: 0 %
Lymphocytes Absolute: 3.1 10*3/uL (ref 0.7–3.1)
Lymphs: 24 %
MCH: 29 pg (ref 26.6–33.0)
MCHC: 33.6 g/dL (ref 31.5–35.7)
MCV: 87 fL (ref 79–97)
Monocytes Absolute: 0.8 10*3/uL (ref 0.1–0.9)
Monocytes: 6 %
Neutrophils Absolute: 8.8 10*3/uL — ABNORMAL HIGH (ref 1.4–7.0)
Neutrophils: 68 %
Platelets: 427 10*3/uL (ref 150–450)
RBC: 4.51 x10E6/uL (ref 3.77–5.28)
RDW: 12.7 % (ref 11.7–15.4)
WBC: 13.1 10*3/uL — ABNORMAL HIGH (ref 3.4–10.8)

## 2021-10-29 LAB — COMPREHENSIVE METABOLIC PANEL
ALT: 13 IU/L (ref 0–32)
AST: 17 IU/L (ref 0–40)
Albumin/Globulin Ratio: 1.6 (ref 1.2–2.2)
Albumin: 4.5 g/dL (ref 3.9–5.0)
Alkaline Phosphatase: 125 IU/L — ABNORMAL HIGH (ref 44–121)
BUN/Creatinine Ratio: 15 (ref 9–23)
BUN: 10 mg/dL (ref 6–20)
Bilirubin Total: 0.3 mg/dL (ref 0.0–1.2)
CO2: 23 mmol/L (ref 20–29)
Calcium: 9.8 mg/dL (ref 8.7–10.2)
Chloride: 102 mmol/L (ref 96–106)
Creatinine, Ser: 0.66 mg/dL (ref 0.57–1.00)
Globulin, Total: 2.8 g/dL (ref 1.5–4.5)
Glucose: 80 mg/dL (ref 70–99)
Potassium: 4.3 mmol/L (ref 3.5–5.2)
Sodium: 138 mmol/L (ref 134–144)
Total Protein: 7.3 g/dL (ref 6.0–8.5)
eGFR: 127 mL/min/{1.73_m2} (ref >59)

## 2021-10-29 LAB — LIPID PANEL
Chol/HDL Ratio: 3.5 ratio (ref 0.0–4.4)
Cholesterol, Total: 145 mg/dL (ref 100–199)
HDL: 41 mg/dL (ref >39)
LDL Chol Calc (NIH): 90 mg/dL (ref 0–99)
Triglycerides: 71 mg/dL (ref 0–149)
VLDL Cholesterol Cal: 14 mg/dL (ref 5–40)

## 2021-10-29 LAB — ACUTE VIRAL HEPATITIS (HAV, HBV, HCV)
HCV Ab: 0.1 s/co ratio (ref 0.0–0.9)
Hep A IgM: NEGATIVE
Hep B C IgM: NEGATIVE
Hepatitis B Surface Ag: NEGATIVE

## 2021-10-29 LAB — HEPATIC FUNCTION PANEL: Bilirubin, Direct: <0.1 mg/dL (ref 0.00–0.40)

## 2021-10-29 LAB — HCV INTERPRETATION

## 2021-10-29 NOTE — Progress Notes (Signed)
Please let patient know that one of her liver enzymes is elevated.  Her white blood cell count is 13.1. No evidence of hepatitis. I would like to proceed with the ultrasound of the liver.  I have ordered this and you should hear about scheduling an appt.

## 2021-10-29 NOTE — Addendum Note (Signed)
Addended by: Larae Grooms on: 10/29/2021 09:08 AM   Modules accepted: Orders

## 2021-11-01 DIAGNOSIS — R112 Nausea with vomiting, unspecified: Secondary | ICD-10-CM | POA: Diagnosis not present

## 2021-11-01 DIAGNOSIS — R1084 Generalized abdominal pain: Secondary | ICD-10-CM | POA: Diagnosis not present

## 2021-11-03 LAB — H. PYLORI ANTIGEN, STOOL: H pylori Ag, Stl: NEGATIVE

## 2021-11-04 NOTE — Progress Notes (Signed)
Hi [place].  Your lab work was negative for h pylori.  Lets get that ultrasound to see if we can figure out what is going on.

## 2021-11-07 ENCOUNTER — Ambulatory Visit
Admission: RE | Admit: 2021-11-07 | Discharge: 2021-11-07 | Disposition: A | Discharge status: Home / Self Care | Payer: BC Managed Care – PPO | Source: Ambulatory Visit | Attending: Nurse Practitioner | Admitting: Nurse Practitioner

## 2021-11-07 ENCOUNTER — Other Ambulatory Visit: Payer: Self-pay

## 2021-11-07 DIAGNOSIS — R748 Abnormal levels of other serum enzymes: Secondary | ICD-10-CM | POA: Diagnosis not present

## 2021-11-07 DIAGNOSIS — K760 Fatty (change of) liver, not elsewhere classified: Secondary | ICD-10-CM | POA: Diagnosis not present

## 2021-11-08 NOTE — Progress Notes (Signed)
Please let patient know that the ultrasound showed she has a fatty liver. It does not show any reason for her vomiting. I recommend she see gastroenterology for further evaluation.

## 2021-12-24 ENCOUNTER — Ambulatory Visit: Payer: BC Managed Care – PPO | Admitting: Nurse Practitioner

## 2021-12-26 ENCOUNTER — Telehealth: Payer: Self-pay | Admitting: Nurse Practitioner

## 2021-12-26 ENCOUNTER — Encounter: Payer: Self-pay | Admitting: Nurse Practitioner

## 2021-12-26 NOTE — Telephone Encounter (Signed)
Please let patient know that her GI referral was sent to [facility].  They have tried calling her 3 times to schedule and sent you a new patient packet.  Please have her call them to schedule an appt.

## 2021-12-27 NOTE — Telephone Encounter (Signed)
Left a message in regards to GI referral in previous message. Provided patient with [facility] contact information. Advised patient to give their office a call back if she has any questions or concerns.

## 2022-01-07 ENCOUNTER — Other Ambulatory Visit: Payer: Self-pay

## 2022-01-07 ENCOUNTER — Ambulatory Visit (INDEPENDENT_AMBULATORY_CARE_PROVIDER_SITE_OTHER): Payer: BC Managed Care – PPO | Admitting: Gastroenterology

## 2022-01-07 ENCOUNTER — Encounter: Payer: Self-pay | Admitting: Gastroenterology

## 2022-01-07 VITALS — BP 127/82 | HR 93 | Temp 97.5°F | Ht 61.0 in | Wt 184.6 lb

## 2022-01-07 DIAGNOSIS — M545 Low back pain, unspecified: Secondary | ICD-10-CM

## 2022-01-07 DIAGNOSIS — R748 Abnormal levels of other serum enzymes: Secondary | ICD-10-CM | POA: Diagnosis not present

## 2022-01-07 DIAGNOSIS — R1031 Right lower quadrant pain: Secondary | ICD-10-CM

## 2022-01-07 NOTE — Progress Notes (Signed)
Kelly Mood MD, MRCP(U.K) [address]  [address]  [place], [place] [address]  Main: [phone]  Fax: [phone]   Gastroenterology Consultation  Referring Provider:     Larae Grooms, NP Primary Care Physician:  Kelly Grooms, NP Primary Gastroenterologist:  Dr. Wyline Pace  Reason for Consultation:     Abdominal pain and elevated liver enzymes        HPI:   Kelly Pace is a 21 y.o. y/o female referred for consultation & management  by Kelly Grooms, NP.    Patient here today to see me for abdominal pain which has been going on for over a year.  Complains of radiating to right lower quadrant right abdomen worse with changing position less than she lays on that side.  Not affected by food intake.  She does not have a bowel movement every day as it once every few days.  Hard in nature.  Has noticed improvement with pain with passage of stools.  Denies any NSAID use.  Pain at times radiates to the back.  She denies any new medications that she has started occasional marijuana use no illegal drug use .  Has multiple tattoos which were professionally made.  No incarceration.  No Financial planner.  No history of blood transfusions.  She has lost some weight recently with change in diet.   10/28/2021 H. pylori stool antigen negative, CMP shows alkaline phosphatase elevated 125 acute hepatitis serology was negative.  Hemoglobin 13.1 g 11/07/2021: Right upper quadrant ultrasound gallbladder and common bile duct abnormal features of hepatic steatosis noted   Past Medical History:  Diagnosis Date   Anxiety    Asthma    Bipolar 2 disorder ([facility])    Eczema    Wears contact lenses     Past Surgical History:  Procedure Laterality Date   NO PAST SURGERIES     TONSILLECTOMY N/A 08/31/2019   Procedure: TONSILLECTOMY;  Surgeon: Bud Face, MD;  Location: [facility];  Service: ENT;  Laterality: N/A;    Prior to Admission medications   Medication Sig  Start Date End Date Taking? Authorizing Provider  doxepin (SINEQUAN) 50 MG capsule Take 50 mg by mouth at bedtime as needed.    [provider]  lamoTRIgine (LAMICTAL) 25 MG tablet Take 2 tablets (50 mg total) by mouth daily. Take 2 tablets daily 10/28/21   Kelly Grooms, NP  sertraline (ZOLOFT) 50 MG tablet Take 1 tablet (50 mg total) by mouth daily. 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   Physical Exam:  There  were no vitals taken for this visit. No LMP recorded. Patient has had an implant. Psych:  Alert and cooperative. Normal Pace and affect. General:   Alert,  Well-developed, well-nourished, pleasant and cooperative in NAD Head:  Normocephalic and atraumatic. Eyes:  Sclera clear, no icterus.   Conjunctiva pink. Ears:  Normal auditory acuity. Nose:  No deformity, discharge, or lesions. Mouth:  No deformity or lesions,oropharynx pink & moist. Neck:  Supple; no masses or thyromegaly. Lungs:  Respirations even and unlabored.  Clear throughout to auscultation.   No wheezes, crackles, or rhonchi. No acute distress. Heart:  Regular rate and rhythm; no murmurs, clicks, rubs, or gallops. Abdomen: Tenderness in  the right lower lumbosacral paraspinal muscles and in the area just above the iliac crest.  It replicates the pain on deep pressure.  Normal bowel sounds.  No bruits.  Soft, non-tender and non-distended without masses, hepatosplenomegaly or hernias noted.  No guarding or rebound tenderness.    Neurologic:  Alert and oriented x3;  grossly normal neurologically. Psych:  Alert and cooperative. Normal Pace and affect.  Imaging Studies: No results found.  Assessment and Plan:   Kelly Pace is a 21 y.o. y/o female has been referred for abdominal pain and elevated alkaline phosphatase that is isolated.  At 125 with normal AST ALT and total bilirubin.  I will check gamma GT and if it is elevated would indicate a liver cirrhosis that is not elevated would mean that it is not from the liver and not associated to be entertained such as metabolic causes.  Right upper quadrant ultrasound shows no obstruction but shows features of hepatic steatosis.  Abnormal liver enzymes from lamotrigine associated usually with an elevated AST ALT more than alkaline phosphatase.Alkaline phosphatase was completely normal back in December 2021.  The abdominal pain likely is a combination from musculoskeletal origin as well as IBS constipation.  Plan 1.  Fractionated alkaline phosphatase, GGT, PTH, calcium, vitamin D, celiac serology, AMA 2.  High-fiber diet able 25 g of fiber per day patient information provided , attempt to use MiraLAX 1 capful daily.  We will reassess her pain with resolution of constipation recommended if still present or not.  Advised to use a foam mattress and change posture during sleeping.  HIDA scan. 3.  Next visit if the pain is no better despite resolution of the constipation will consider colonoscopy and CAT scan of the abdomen.  Follow up in 6 to 8 weeks  Dr Kelly Mood MD,MRCP(U.K)

## 2022-01-07 NOTE — Progress Notes (Signed)
[place] scan appointment set for march 6 at [facility] at [address]

## 2022-01-07 NOTE — Patient Instructions (Signed)
High-Fiber Eating Plan °Fiber, also called dietary fiber, is a type of carbohydrate. It is found foods such as fruits, vegetables, whole grains, and beans. A high-fiber diet can have many health benefits. Your health care provider may recommend a high-fiber diet to help: °Prevent constipation. Fiber can make your bowel movements more regular. °Lower your cholesterol. °Relieve the following conditions: °Inflammation of veins in the anus (hemorrhoids). °Inflammation of specific areas of the digestive tract (uncomplicated diverticulosis). °A problem of the large intestine, also called the colon, that sometimes causes pain and diarrhea (irritable bowel syndrome, or IBS). °Prevent overeating as part of a weight-loss plan. °Prevent heart disease, type 2 diabetes, and certain cancers. °What are tips for following this plan? °Reading food labels ° °Check the nutrition facts label on food products for the amount of dietary fiber. Choose foods that have 5 grams of fiber or more per serving. °The goals for recommended daily fiber intake include: °Men (age 50 or younger): 34-38 g. °Men (over age 50): 28-34 g. °Women (age 50 or younger): 25-28 g. °Women (over age 50): 22-25 g. °Your daily fiber goal is _____________ g. °Shopping °Choose whole fruits and vegetables instead of processed forms, such as apple juice or applesauce. °Choose a wide variety of high-fiber foods such as avocados, lentils, oats, and kidney beans. °Read the nutrition facts label of the foods you choose. Be aware of foods with added fiber. These foods often have high sugar and sodium amounts per serving. °Cooking °Use whole-grain flour for baking and cooking. °Cook with brown rice instead of white rice. °Meal planning °Start the day with a breakfast that is high in fiber, such as a cereal that contains 5 g of fiber or more per serving. °Eat breads and cereals that are made with whole-grain flour instead of refined flour or white flour. °Eat brown rice, bulgur  wheat, or millet instead of white rice. °Use beans in place of meat in soups, salads, and pasta dishes. °Be sure that half of the grains you eat each day are whole grains. °General information °You can get the recommended daily intake of dietary fiber by: °Eating a variety of fruits, vegetables, grains, nuts, and beans. °Taking a fiber supplement if you are not able to take in enough fiber in your diet. It is better to get fiber through food than from a supplement. °Gradually increase how much fiber you consume. If you increase your intake of dietary fiber too quickly, you may have bloating, cramping, or gas. °Drink plenty of water to help you digest fiber. °Choose high-fiber snacks, such as berries, raw vegetables, nuts, and popcorn. °What foods should I eat? °Fruits °Berries. Pears. Apples. Oranges. Avocado. Prunes and raisins. Dried figs. °Vegetables °Sweet potatoes. Spinach. Kale. Artichokes. Cabbage. Broccoli. Cauliflower. Green peas. Carrots. Squash. °Grains °Whole-grain breads. Multigrain cereal. Oats and oatmeal. Brown rice. Barley. Bulgur wheat. Millet. Quinoa. Bran muffins. Popcorn. Rye wafer crackers. °Meats and other proteins °Navy beans, kidney beans, and pinto beans. Soybeans. Split peas. Lentils. Nuts and seeds. °Dairy °Fiber-fortified yogurt. °Beverages °Fiber-fortified soy milk. Fiber-fortified orange juice. °Other foods °Fiber bars. °The items listed above may not be a complete list of recommended foods and beverages. Contact a dietitian for more information. °What foods should I avoid? °Fruits °Fruit juice. Cooked, strained fruit. °Vegetables °Fried potatoes. Canned vegetables. Well-cooked vegetables. °Grains °White bread. Pasta made with refined flour. White rice. °Meats and other proteins °Fatty cuts of meat. Fried chicken or fried fish. °Dairy °Milk. Yogurt. Cream cheese. Sour cream. °Fats and   oils °Butters. °Beverages °Soft drinks. °Other foods °Cakes and pastries. °The items listed above may  not be a complete list of foods and beverages to avoid. Talk with your dietitian about what choices are best for you. °Summary °Fiber is a type of carbohydrate. It is found in foods such as fruits, vegetables, whole grains, and beans. °A high-fiber diet has many benefits. It can help to prevent constipation, lower blood cholesterol, aid weight loss, and reduce your risk of heart disease, diabetes, and certain cancers. °Increase your intake of fiber gradually. Increasing fiber too quickly may cause cramping, bloating, and gas. Drink plenty of water while you increase the amount of fiber you consume. °The best sources of fiber include whole fruits and vegetables, whole grains, nuts, seeds, and beans. °This information is not intended to replace advice given to you by your health care provider. Make sure you discuss any questions you have with your health care provider. °Document Revised: 03/08/2020 Document Reviewed: 03/08/2020 °Elsevier Patient Education © 2022 [employer]. ° °

## 2022-01-07 NOTE — Addendum Note (Signed)
Addended by: Linward Foster on: 01/07/2022 02:05 PM   Modules accepted: Orders

## 2022-01-08 ENCOUNTER — Telehealth: Payer: Self-pay

## 2022-01-08 NOTE — Telephone Encounter (Signed)
Insurance company denied patient [place] Hepato W They said a peer to peer needed to be done to get scan approved. They said the doctor would need to call because they do not set up times for the call. Phone number is [phone]

## 2022-01-09 NOTE — Telephone Encounter (Signed)
Called patient to let her know that her insurance did not cover for the HIDA Scan at this time. They were requiring a peer-to-peer call but Dr. Tobi Bastos stated to hold off for now. Patient understood. I then called central scheduling to cancel it. Patient has a follow up appointment with Dr. Tobi Bastos on 03/03/2022.

## 2022-01-12 LAB — MITOCHONDRIAL/SMOOTH MUSCLE AB PNL
Mitochondrial Ab: <20 Units (ref 0.0–20.0)
Smooth Muscle Ab: 21 Units — ABNORMAL HIGH (ref 0–19)

## 2022-01-12 LAB — ALKALINE PHOSPHATASE, ISOENZYMES
Alkaline Phosphatase: 126 IU/L — ABNORMAL HIGH (ref 44–121)
BONE FRACTION: 40 % (ref 14–68)
INTESTINAL FRAC.: 5 % (ref 0–18)
LIVER FRACTION: 55 % (ref 18–85)

## 2022-01-12 LAB — PTH, INTACT AND CALCIUM
Calcium: 9.6 mg/dL (ref 8.7–10.2)
PTH: 19 pg/mL (ref 15–65)

## 2022-01-12 LAB — ANA: Anti Nuclear Antibody (ANA): NEGATIVE

## 2022-01-12 LAB — TSH: TSH: 0.578 u[IU]/mL (ref 0.450–4.500)

## 2022-01-12 LAB — GAMMA GT: GGT: 14 IU/L (ref 0–60)

## 2022-01-20 ENCOUNTER — Ambulatory Visit: Payer: BC Managed Care – PPO

## 2022-01-27 ENCOUNTER — Encounter: Payer: Self-pay | Admitting: Nurse Practitioner

## 2022-01-27 NOTE — Progress Notes (Deleted)
? ?There were no vitals taken for this visit.  ? ?Subjective:  ? ? Patient ID: Kelly Pace, female    DOB: [birth year], 21 y.o.   MRN: [id number] ? ?HPI: ?Kelly Pace is a 21 y.o. female presenting on 01/27/2022 for comprehensive medical examination. Current medical complaints include:{Blank single:19197::"none","***"} ? ?She currently lives with: ?Menopausal Symptoms: {Blank single:19197::"yes","no"} ? ?Depression Screen done today and results listed below:  ?[facility]  ?Decreased Interest 0  ?Down, Depressed, Hopeless 1  ?PHQ - 2 Score 1  ?Altered sleeping 3  ?Tired, decreased energy 0  ?Change in appetite 3  ?Feeling bad or failure about yourself  0  ?Trouble concentrating 1  ?Moving slowly or fidgety/restless 0  ?Suicidal thoughts 0  ?PHQ-9 Score 8  ?Difficult doing work/chores Very difficult  ?Some encounter information is confidential and restricted. Go to Review Flowsheets activity to see all data.  ? ? ?The patient {has/does not have:19849} a history of falls. I {did/did not:19850} complete a risk assessment for falls. A plan of care for falls {was/was not:19852} documented. ? ? ?Past Medical History:  ?Past Medical History:  ?Diagnosis Date  ? Anxiety   ? Asthma   ? Bipolar 2 disorder (HCC)   ? Eczema   ? Wears contact lenses   ? ? ?Surgical History:  ?Past Surgical History:  ?Procedure Laterality Date  ? NO PAST SURGERIES    ? TONSILLECTOMY N/A 08/31/2019  ? Procedure: TONSILLECTOMY;  Surgeon: Bud FaceVaught, Creighton, MD;  [facility];  Service: ENT;  Laterality: N/A;  ? ? ?Medications:  ?Current Outpatient Medications on File Prior to Visit  ?Medication Sig  ? doxepin (SINEQUAN) 50 MG capsule Take 50 mg by mouth at bedtime as needed.  ? lamoTRIgine (LAMICTAL) 25 MG tablet Take 2 tablets (50 mg total) by mouth daily. Take 2 tablets daily  ? sertraline (ZOLOFT) 50 MG tablet Take 1 tablet (50 mg total) by mouth daily.  ? ?No current facility-administered medications on  file prior to visit.  ? ? ?Allergies:  ?Allergies  ?Allergen Reactions  ? Cashew Nut Oil Anaphylaxis  ? Fish Allergy Anaphylaxis  ?  ALL seafood  ? Watermelon [Citrullus Vulgaris] Itching  ?  tongue  ? Nickel Rash  ? ? ?Social History:  ?Social History  ? ?Socioeconomic History  ? Marital status: Single  ?  Spouse name: Not on file  ? Number of children: Not on file  ? Years of education: Not on file  ? Highest education level: Not on file  ?Occupational History  ? Not on file  ?Tobacco Use  ? Smoking status: Former  ? Smokeless tobacco: Never  ? Tobacco comments:  ?  socially in HS  ?Vaping Use  ? Vaping Use: Never used  ?Substance and Sexual Activity  ? Alcohol use: Never  ? Drug use: Yes  ?  Types: Marijuana  ?  Comment: on occasion  ? Sexual activity: Yes  ?Other Topics Concern  ? Not on file  ?Social History Narrative  ? Not on file  ? ?Social Determinants of Health  ? ?Financial Resource Strain: Not on file  ?Food Insecurity: Not on file  ?Transportation Needs: Not on file  ?Physical Activity: Not on file  ?Stress: Not on file  ?Social Connections: Not on file  ?Intimate Partner Violence: Not on file  ? ?Social History  ? ?Tobacco Use  ?Smoking Status Former  ?Smokeless Tobacco Never  ?Tobacco Comments  ? socially in HS  ? ?  Social History  ? ?Substance and Sexual Activity  ?Alcohol Use Never  ? ? ?Family History:  ?Family History  ?Problem Relation Age of Onset  ? Migraines Father   ? Bipolar disorder Maternal Uncle   ? Heart disease Paternal Aunt   ? Breast cancer Maternal Grandmother   ? ? ?Past medical history, surgical history, medications, allergies, family history and social history reviewed with patient today and changes made to appropriate areas of the chart.  ? ?ROS ?All other ROS negative except what is listed above and in the HPI.  ? ?   ?Objective:  ?  ?There were no vitals taken for this visit.  ?Wt Readings from Last 3 Encounters:  ?01/07/22 184 lb 9.6 oz (83.7 kg)  ?10/28/21 185 lb 12.8 oz (84.3  kg)  ?08/31/19 180 lb (81.6 kg)  ?  ?Physical Exam ? ?Results for orders placed or performed in visit on 01/07/22  ?PTH, intact and calcium  ?Result Value Ref Range  ? Calcium 9.6 8.7 - 10.2 mg/dL  ? PTH 19 15 - 65 pg/mL  ? PTH Interp Comment   ?Gamma GT  ?Result Value Ref Range  ? GGT 14 0 - 60 IU/L  ?Alkaline phosphatase, isoenzymes  ?Result Value Ref Range  ? Alkaline Phosphatase 126 (H) 44 - 121 IU/L  ? LIVER FRACTION 55 18 - 85 %  ? BONE FRACTION 40 14 - 68 %  ? INTESTINAL FRAC. 5 0 - 18 %  ?TSH  ?Result Value Ref Range  ? TSH 0.578 0.450 - 4.500 uIU/mL  ?ANA  ?Result Value Ref Range  ? Anti Nuclear Antibody (ANA) Negative Negative  ?Mitochondrial/smooth muscle ab pnl  ?Result Value Ref Range  ? Smooth Muscle Ab 21 (H) 0 - 19 Units  ? Mitochondrial Ab <20.0 0.0 - 20.0 Units  ? ?   ?Assessment & Plan:  ? ?Problem List Items Addressed This Visit   ?None ?  ? ?Follow up plan: ?No follow-ups on file. ? ? ?LABORATORY TESTING:  ?- Pap smear: {Blank single:19197::"pap done","not applicable","up to date","done elsewhere"} ? ?IMMUNIZATIONS:   ?- Tdap: Tetanus vaccination status reviewed: {tetanus status:315746}. ?- Influenza: {Blank single:19197::"Up to date","Administered today","Postponed to flu season","Refused","Given elsewhere"} ?- Pneumovax: {Blank single:19197::"Up to date","Administered today","Not applicable","Refused","Given elsewhere"} ?- Prevnar: {Blank single:19197::"Up to date","Administered today","Not applicable","Refused","Given elsewhere"} ?- COVID: {Blank single:19197::"Up to date","Administered today","Not applicable","Refused","Given elsewhere"} ?- HPV: {Blank single:19197::"Up to date","Administered today","Not applicable","Refused","Given elsewhere"} ?- Shingrix vaccine: {Blank single:19197::"Up to date","Administered today","Not applicable","Refused","Given elsewhere"} ? ?SCREENING: ?-Mammogram: {Blank single:19197::"Up to date","Ordered today","Not applicable","Refused","Done elsewhere"}  ?-  Colonoscopy: {Blank single:19197::"Up to date","Ordered today","Not applicable","Refused","Done elsewhere"}  ?- Bone Density: {Blank single:19197::"Up to date","Ordered today","Not applicable","Refused","Done elsewhere"}  ?-Hearing Test: {Blank single:19197::"Up to date","Ordered today","Not applicable","Refused","Done elsewhere"}  ?-Spirometry: {Blank single:19197::"Up to date","Ordered today","Not applicable","Refused","Done elsewhere"}  ? ?PATIENT COUNSELING:   ?Advised to take 1 mg of folate supplement per day if capable of pregnancy.  ? ?Sexuality: Discussed sexually transmitted diseases, partner selection, use of condoms, avoidance of unintended pregnancy  and contraceptive alternatives.  ? ?Advised to avoid cigarette smoking. ? ?I discussed with the patient that most people either abstain from alcohol or drink within safe limits (<=14/week and <=4 drinks/occasion for males, <=7/weeks and <= 3 drinks/occasion for females) and that the risk for alcohol disorders and other health effects rises proportionally with the number of drinks per week and how often a drinker exceeds daily limits. ? ?Discussed cessation/primary prevention of drug use and availability of treatment for abuse.  ? ?Diet: Encouraged  to adjust caloric intake to maintain  or achieve ideal body weight, to reduce intake of dietary saturated fat and total fat, to limit sodium intake by avoiding high sodium foods and not adding table salt, and to maintain adequate dietary potassium and calcium preferably from fresh fruits, vegetables, and low-fat dairy products.   ? ?stressed the importance of regular exercise ? ?Injury prevention: Discussed safety belts, safety helmets, smoke detector, smoking near bedding or upholstery.  ? ?Dental health: Discussed importance of regular tooth brushing, flossing, and dental visits.  ? ? ?NEXT PREVENTATIVE PHYSICAL DUE IN 1 YEAR. ?No follow-ups on file. ? ? ? ? ? ? ? ? ? ?

## 2022-02-11 NOTE — Progress Notes (Signed)
? ?BP 121/81   Pulse 82   Temp 98.5 ?F (36.9 ?C) (Oral)   Ht 5' 1.42" (1.56 m)   Wt 185 lb 12.8 oz (84.3 kg)   LMP 01/24/2022 (Exact Date)   SpO2 100%   BMI 34.63 kg/m?   ? ?Subjective:  ? ? Patient ID: Kelly Pace, female    DOB: [birth year], 21 y.o.   MRN: [id number] ? ?HPI: ?Kelly Pace is a 21 y.o. female presenting on 02/12/2022 for comprehensive medical examination. Current medical complaints include: Abdominal pain ? ?She currently lives with: ?Menopausal Symptoms: no ? ?Patient states she was doing well with the high fiber diet for almost a week then she started having symptoms again.  Patient states she has been taking doxapin PRN for sleep.  On the nights she isn't taking her medication she wakes up vomiting.   ? ? ?Depression Screen done today and results listed below:  ? ?  02/12/2022  ? 10:56 AM 10/28/2021  ?  2:13 PM 06/25/2021  ?  3:15 PM 03/28/2021  ?  3:24 PM  ?Depression screen PHQ 2/9  ?Decreased Interest 2 0    ?Down, Depressed, Hopeless 1 1    ?PHQ - 2 Score 3 1    ?Altered sleeping 3 3    ?Tired, decreased energy 1 0    ?Change in appetite 2 3    ?Feeling bad or failure about yourself  0 0    ?Trouble concentrating 0 1    ?Moving slowly or fidgety/restless 0 0    ?Suicidal thoughts 0 0    ?PHQ-9 Score 9 8    ?Difficult doing work/chores Somewhat difficult Very difficult    ?  ? Information is confidential and restricted. Go to Review Flowsheets to unlock data.  ? ? ?The patient does not have a history of falls. I did complete a risk assessment for falls. A plan of care for falls was documented. ? ? ?Past Medical History:  ?Past Medical History:  ?Diagnosis Date  ? Anxiety   ? Asthma   ? Bipolar 2 disorder (HCC)   ? Eczema   ? Wears contact lenses   ? ? ?Surgical History:  ?Past Surgical History:  ?Procedure Laterality Date  ? NO PAST SURGERIES    ? TONSILLECTOMY N/A 08/31/2019  ? Procedure: TONSILLECTOMY;  Surgeon: Bud Face, MD;  [facility]: [facility];  Service: ENT;   Laterality: N/A;  ? ? ?Medications:  ?Current Outpatient Medications on File Prior to Visit  ?Medication Sig  ? doxepin (SINEQUAN) 50 MG capsule Take 50 mg by mouth at bedtime as needed.  ? lamoTRIgine (LAMICTAL) 25 MG tablet Take 2 tablets (50 mg total) by mouth daily. Take 2 tablets daily (Patient not taking: Reported on 02/12/2022)  ? sertraline (ZOLOFT) 50 MG tablet Take 1 tablet (50 mg total) by mouth daily. (Patient not taking: Reported on 02/12/2022)  ? ?No current facility-administered medications on file prior to visit.  ? ? ?Allergies:  ?Allergies  ?Allergen Reactions  ? Cashew Nut Oil Anaphylaxis  ? Fish Allergy Anaphylaxis  ?  ALL seafood  ? Watermelon [Citrullus Vulgaris] Itching  ?  tongue  ? Nickel Rash  ? ? ?Social History:  ?Social History  ? ?Socioeconomic History  ? Marital status: Single  ?  Spouse name: Not on file  ? Number of children: Not on file  ? Years of education: Not on file  ? Highest education level: Not on file  ?Occupational History  ?  Not on file  ?Tobacco Use  ? Smoking status: Never  ? Smokeless tobacco: Never  ? Tobacco comments:  ?  socially in HS  ?Vaping Use  ? Vaping Use: Never used  ?Substance and Sexual Activity  ? Alcohol use: Never  ? Drug use: Yes  ?  Types: Marijuana  ?  Comment: on occasion  ? Sexual activity: Yes  ?Other Topics Concern  ? Not on file  ?Social History Narrative  ? Not on file  ? ?Social Determinants of Health  ? ?Financial Resource Strain: Not on file  ?Food Insecurity: Not on file  ?Transportation Needs: Not on file  ?Physical Activity: Not on file  ?Stress: Not on file  ?Social Connections: Not on file  ?Intimate Partner Violence: Not on file  ? ?Social History  ? ?Tobacco Use  ?Smoking Status Never  ?Smokeless Tobacco Never  ?Tobacco Comments  ? socially in HS  ? ?Social History  ? ?Substance and Sexual Activity  ?Alcohol Use Never  ? ? ?Family History:  ?Family History  ?Problem Relation Age of Onset  ? Migraines Father   ? Bipolar disorder Maternal  Uncle   ? Heart disease Paternal Aunt   ? Breast cancer Maternal Grandmother   ? ? ?Past medical history, surgical history, medications, allergies, family history and social history reviewed with patient today and changes made to appropriate areas of the chart.  ? ?ROS ?All other ROS negative except what is listed above and in the HPI.  ? ?   ?Objective:  ?  ?BP 121/81   Pulse 82   Temp 98.5 ?F (36.9 ?C) (Oral)   Ht 5' 1.42" (1.56 m)   Wt 185 lb 12.8 oz (84.3 kg)   LMP 01/24/2022 (Exact Date)   SpO2 100%   BMI 34.63 kg/m?   ?Wt Readings from Last 3 Encounters:  ?02/12/22 185 lb 12.8 oz (84.3 kg)  ?01/07/22 184 lb 9.6 oz (83.7 kg)  ?10/28/21 185 lb 12.8 oz (84.3 kg)  ?  ?Physical Exam ? ?Results for orders placed or performed in visit on 01/07/22  ?PTH, intact and calcium  ?Result Value Ref Range  ? Calcium 9.6 8.7 - 10.2 mg/dL  ? PTH 19 15 - 65 pg/mL  ? PTH Interp Comment   ?Gamma GT  ?Result Value Ref Range  ? GGT 14 0 - 60 IU/L  ?Alkaline phosphatase, isoenzymes  ?Result Value Ref Range  ? Alkaline Phosphatase 126 (H) 44 - 121 IU/L  ? LIVER FRACTION 55 18 - 85 %  ? BONE FRACTION 40 14 - 68 %  ? INTESTINAL FRAC. 5 0 - 18 %  ?TSH  ?Result Value Ref Range  ? TSH 0.578 0.450 - 4.500 uIU/mL  ?ANA  ?Result Value Ref Range  ? Anti Nuclear Antibody (ANA) Negative Negative  ?Mitochondrial/smooth muscle ab pnl  ?Result Value Ref Range  ? Smooth Muscle Ab 21 (H) 0 - 19 Units  ? Mitochondrial Ab <20.0 0.0 - 20.0 Units  ? ?   ?Assessment & Plan:  ? ?Problem List Items Addressed This Visit   ?None ?Visit Diagnoses   ? ? Annual physical exam    -  Primary  ? Health maintenance reviewed during visit today. Labs ordered. Will get PAP at GYN. TDAP and HPV given in office.   ? Relevant Orders  ? CBC with Differential/Platelet  ? Comprehensive metabolic panel  ? Lipid panel  ? TSH  ? Urinalysis, Routine w reflex microscopic  ? Nausea  and vomiting, unspecified vomiting type      ? Ongoing. Take Doxepin daily to see if symptoms  resolve. Follow up in 1 week for reevaluation of symptoms.  Keep appt with GI in April.  ? Need for tetanus booster      ? Relevant Orders  ? Tdap vaccine greater than or equal to 7yo IM  ? Need for HPV vaccination      ? Relevant Orders  ? HPV 9-valent vaccine,Recombinat  ? HPV 9-valent vaccine,Recombinat  ? HPV 9-valent vaccine,Recombinat  ? ?  ?  ? ?Follow up plan: ?Return in about 1 week (around 02/19/2022) for ABdominal pain. ? ? ?LABORATORY TESTING:  ?- Pap smear: done elsewhere ? ?IMMUNIZATIONS:   ?- Tdap: Tetanus vaccination status reviewed: last tetanus booster within 10 years. ?- Influenza: Up to date ?- Pneumovax: Not applicable ?- Prevnar: Not applicable ?- COVID: Not applicable ?- HPV: Administered today ?- Shingrix vaccine: Not applicable ? ?SCREENING: ?-Mammogram: Not applicable  ?- Colonoscopy: Not applicable  ?- Bone Density: Not applicable  ?-Hearing Test: Not applicable  ?-Spirometry: Not applicable ?  ? ?PATIENT COUNSELING:   ?Advised to take 1 mg of folate supplement per day if capable of pregnancy.  ? ?Sexuality: Discussed sexually transmitted diseases, partner selection, use of condoms, avoidance of unintended pregnancy  and contraceptive alternatives.  ? ?Advised to avoid cigarette smoking. ? ?I discussed with the patient that most people either abstain from alcohol or drink within safe limits (<=14/week and <=4 drinks/occasion for males, <=7/weeks and <= 3 drinks/occasion for females) and that the risk for alcohol disorders and other health effects rises proportionally with the number of drinks per week and how often a drinker exceeds daily limits. ? ?Discussed cessation/primary prevention of drug use and availability of treatment for abuse.  ? ?Diet: Encouraged to adjust caloric intake to maintain  or achieve ideal body weight, to reduce intake of dietary saturated fat and total fat, to limit sodium intake by avoiding high sodium foods and not adding table salt, and to maintain adequate  dietary potassium and calcium preferably from fresh fruits, vegetables, and low-fat dairy products.   ? ?stressed the importance of regular exercise ? ?Injury prevention: Discussed safety belts, safety helmets, smoke detect

## 2022-02-12 ENCOUNTER — Ambulatory Visit (INDEPENDENT_AMBULATORY_CARE_PROVIDER_SITE_OTHER): Payer: BC Managed Care – PPO | Admitting: Nurse Practitioner

## 2022-02-12 ENCOUNTER — Encounter: Payer: Self-pay | Admitting: Nurse Practitioner

## 2022-02-12 ENCOUNTER — Other Ambulatory Visit: Payer: Self-pay

## 2022-02-12 VITALS — BP 121/81 | HR 82 | Temp 98.5°F | Ht 61.42 in | Wt 185.8 lb

## 2022-02-12 DIAGNOSIS — R8290 Unspecified abnormal findings in urine: Secondary | ICD-10-CM

## 2022-02-12 DIAGNOSIS — Z0000 Encounter for general adult medical examination without abnormal findings: Secondary | ICD-10-CM | POA: Diagnosis not present

## 2022-02-12 DIAGNOSIS — Z136 Encounter for screening for cardiovascular disorders: Secondary | ICD-10-CM | POA: Diagnosis not present

## 2022-02-12 DIAGNOSIS — R112 Nausea with vomiting, unspecified: Secondary | ICD-10-CM

## 2022-02-12 DIAGNOSIS — Z23 Encounter for immunization: Secondary | ICD-10-CM

## 2022-02-12 DIAGNOSIS — R7303 Prediabetes: Secondary | ICD-10-CM | POA: Diagnosis not present

## 2022-02-12 LAB — URINALYSIS, ROUTINE W REFLEX MICROSCOPIC
Bilirubin, UA: NEGATIVE
Glucose, UA: NEGATIVE
Ketones, UA: NEGATIVE
Nitrite, UA: NEGATIVE
Protein,UA: NEGATIVE
Specific Gravity, UA: >1.03 — ABNORMAL HIGH (ref 1.005–1.030)
Urobilinogen, Ur: 0.2 mg/dL (ref 0.2–1.0)
pH, UA: 5.5 (ref 5.0–7.5)

## 2022-02-12 LAB — MICROSCOPIC EXAMINATION: Bacteria, UA: NONE SEEN

## 2022-02-13 LAB — CBC WITH DIFFERENTIAL/PLATELET
Basophils Absolute: 0 10*3/uL (ref 0.0–0.2)
Basos: 0 %
EOS (ABSOLUTE): 0.7 10*3/uL — ABNORMAL HIGH (ref 0.0–0.4)
Eos: 6 %
Hematocrit: 38.5 % (ref 34.0–46.6)
Hemoglobin: 13 g/dL (ref 11.1–15.9)
Immature Grans (Abs): 0.1 10*3/uL (ref 0.0–0.1)
Immature Granulocytes: 1 %
Lymphocytes Absolute: 2.5 10*3/uL (ref 0.7–3.1)
Lymphs: 22 %
MCH: 29.1 pg (ref 26.6–33.0)
MCHC: 33.8 g/dL (ref 31.5–35.7)
MCV: 86 fL (ref 79–97)
Monocytes Absolute: 0.6 10*3/uL (ref 0.1–0.9)
Monocytes: 6 %
Neutrophils Absolute: 7.4 10*3/uL — ABNORMAL HIGH (ref 1.4–7.0)
Neutrophils: 65 %
Platelets: 399 10*3/uL (ref 150–450)
RBC: 4.47 x10E6/uL (ref 3.77–5.28)
RDW: 12.3 % (ref 11.7–15.4)
WBC: 11.3 10*3/uL — ABNORMAL HIGH (ref 3.4–10.8)

## 2022-02-13 LAB — COMPREHENSIVE METABOLIC PANEL
ALT: 18 IU/L (ref 0–32)
AST: 16 IU/L (ref 0–40)
Albumin/Globulin Ratio: 1.2 (ref 1.2–2.2)
Albumin: 4 g/dL (ref 3.9–5.0)
Alkaline Phosphatase: 111 IU/L (ref 44–121)
BUN/Creatinine Ratio: 15 (ref 9–23)
BUN: 9 mg/dL (ref 6–20)
Bilirubin Total: <0.2 mg/dL (ref 0.0–1.2)
CO2: 21 mmol/L (ref 20–29)
Calcium: 9.2 mg/dL (ref 8.7–10.2)
Chloride: 103 mmol/L (ref 96–106)
Creatinine, Ser: 0.6 mg/dL (ref 0.57–1.00)
Globulin, Total: 3.3 g/dL (ref 1.5–4.5)
Glucose: 86 mg/dL (ref 70–99)
Potassium: 4.2 mmol/L (ref 3.5–5.2)
Sodium: 139 mmol/L (ref 134–144)
Total Protein: 7.3 g/dL (ref 6.0–8.5)
eGFR: 130 mL/min/{1.73_m2} (ref >59)

## 2022-02-13 LAB — LIPID PANEL
Chol/HDL Ratio: 3.2 ratio (ref 0.0–4.4)
Cholesterol, Total: 147 mg/dL (ref 100–199)
HDL: 46 mg/dL (ref >39)
LDL Chol Calc (NIH): 87 mg/dL (ref 0–99)
Triglycerides: 68 mg/dL (ref 0–149)
VLDL Cholesterol Cal: 14 mg/dL (ref 5–40)

## 2022-02-13 LAB — TSH: TSH: 0.955 u[IU]/mL (ref 0.450–4.500)

## 2022-02-13 NOTE — Progress Notes (Signed)
HI Adeli.  It was nice to see you yesterday.  Your lab work looks good.  Your white blood cell count is elevated but has been consistently elevated  We will keep an eye on this in the future.  No other concerns at this time. Continue with your current medication regimen.  Follow up as discussed.  Please let me know if you have any questions.  ?

## 2022-02-14 LAB — URINE CULTURE

## 2022-02-14 NOTE — Progress Notes (Signed)
Hi Brynnan.  There was no growth on your urine culture.  No need for antibiotics.  Follow up as discussed.

## 2022-02-19 ENCOUNTER — Telehealth (INDEPENDENT_AMBULATORY_CARE_PROVIDER_SITE_OTHER): Payer: BC Managed Care – PPO | Admitting: Nurse Practitioner

## 2022-02-19 ENCOUNTER — Encounter: Payer: Self-pay | Admitting: Nurse Practitioner

## 2022-02-19 DIAGNOSIS — R1084 Generalized abdominal pain: Secondary | ICD-10-CM | POA: Diagnosis not present

## 2022-02-19 NOTE — Assessment & Plan Note (Signed)
Symptoms resolved with taking Doxepin daily at bedtime.  Continue to take nightly.  Keep follow up with GI for further evaluation.  Follow up in 1 month to ensure symptoms have still improved.  Will fill medication for patient when she needs a refill.   ?

## 2022-02-19 NOTE — Progress Notes (Signed)
? ?LMP 01/24/2022 (Exact Date)   ? ?Subjective:  ? ? Patient ID: Kelly Pace, female    DOB: [birth year], 21 y.o.   MRN: [id number] ? ?HPI: ?Kelly Pace is a 21 y.o. female ? ?Chief Complaint  ?Patient presents with  ? Abdominal Pain  ? ?ABDOMINAL ISSUES ?Patient states she started taking the doxepin every night.  She hasn't even felt the urge to throw up.  She feels like taking the medication nightly has improved her symptoms.  Denies other concerns at visit today.  Denies and N/V. ? ?Relevant past medical, surgical, family and social history reviewed and updated as indicated. Interim medical history since our last visit reviewed. ?Allergies and medications reviewed and updated. ? ?Review of Systems  ?Gastrointestinal:  Negative for nausea and vomiting.  ? ?Per HPI unless specifically indicated above ? ?   ?Objective:  ?  ?LMP 01/24/2022 (Exact Date)   ?Wt Readings from Last 3 Encounters:  ?02/12/22 185 lb 12.8 oz (84.3 kg)  ?01/07/22 184 lb 9.6 oz (83.7 kg)  ?10/28/21 185 lb 12.8 oz (84.3 kg)  ?  ?Physical Exam ?Vitals and nursing note reviewed.  ?HENT:  ?   Head: Normocephalic.  ?   Right Ear: Hearing normal.  ?   Left Ear: Hearing normal.  ?   Nose: Nose normal.  ?Eyes:  ?   Pupils: Pupils are equal, round, and reactive to light.  ?Pulmonary:  ?   Effort: Pulmonary effort is normal. No respiratory distress.  ?Neurological:  ?   Mental Status: She is alert.  ?Psychiatric:     ?   Mood and Affect: Mood normal.     ?   Behavior: Behavior normal.     ?   Thought Content: Thought content normal.     ?   Judgment: Judgment normal.  ? ? ?Results for orders placed or performed in visit on 02/12/22  ?Microscopic Examination  ? Urine  ?Result Value Ref Range  ? WBC, UA 0-5 0 - 5 /hpf  ? RBC 0-2 0 - 2 /hpf  ? Epithelial Cells (non renal) 0-10 0 - 10 /hpf  ? Bacteria, UA None seen None seen/Few  ?Urine Culture  ? Specimen: Urine  ? UR  ?Result Value Ref Range  ? Urine Culture, Routine Final report   ? Organism ID,  Bacteria Comment   ?CBC with Differential/Platelet  ?Result Value Ref Range  ? WBC 11.3 (H) 3.4 - 10.8 x10E3/uL  ? RBC 4.47 3.77 - 5.28 x10E6/uL  ? Hemoglobin 13.0 11.1 - 15.9 g/dL  ? Hematocrit 38.5 34.0 - 46.6 %  ? MCV 86 79 - 97 fL  ? MCH 29.1 26.6 - 33.0 pg  ? MCHC 33.8 31.5 - 35.7 g/dL  ? RDW 12.3 11.7 - 15.4 %  ? Platelets 399 150 - 450 x10E3/uL  ? Neutrophils 65 Not Estab. %  ? Lymphs 22 Not Estab. %  ? Monocytes 6 Not Estab. %  ? Eos 6 Not Estab. %  ? Basos 0 Not Estab. %  ? Neutrophils Absolute 7.4 (H) 1.4 - 7.0 x10E3/uL  ? Lymphocytes Absolute 2.5 0.7 - 3.1 x10E3/uL  ? Monocytes Absolute 0.6 0.1 - 0.9 x10E3/uL  ? EOS (ABSOLUTE) 0.7 (H) 0.0 - 0.4 x10E3/uL  ? Basophils Absolute 0.0 0.0 - 0.2 x10E3/uL  ? Immature Granulocytes 1 Not Estab. %  ? Immature Grans (Abs) 0.1 0.0 - 0.1 x10E3/uL  ?Comprehensive metabolic panel  ?Result Value Ref Range  ? Glucose  86 70 - 99 mg/dL  ? BUN 9 6 - 20 mg/dL  ? Creatinine, Ser 0.60 0.57 - 1.00 mg/dL  ? eGFR 130 >59 mL/min/1.73  ? BUN/Creatinine Ratio 15 9 - 23  ? Sodium 139 134 - 144 mmol/L  ? Potassium 4.2 3.5 - 5.2 mmol/L  ? Chloride 103 96 - 106 mmol/L  ? CO2 21 20 - 29 mmol/L  ? Calcium 9.2 8.7 - 10.2 mg/dL  ? Total Protein 7.3 6.0 - 8.5 g/dL  ? Albumin 4.0 3.9 - 5.0 g/dL  ? Globulin, Total 3.3 1.5 - 4.5 g/dL  ? Albumin/Globulin Ratio 1.2 1.2 - 2.2  ? Bilirubin Total <0.2 0.0 - 1.2 mg/dL  ? Alkaline Phosphatase 111 44 - 121 IU/L  ? AST 16 0 - 40 IU/L  ? ALT 18 0 - 32 IU/L  ?Lipid panel  ?Result Value Ref Range  ? Cholesterol, Total 147 100 - 199 mg/dL  ? Triglycerides 68 0 - 149 mg/dL  ? HDL 46 >39 mg/dL  ? VLDL Cholesterol Cal 14 5 - 40 mg/dL  ? LDL Chol Calc (NIH) 87 0 - 99 mg/dL  ? Chol/HDL Ratio 3.2 0.0 - 4.4 ratio  ?TSH  ?Result Value Ref Range  ? TSH 0.955 0.450 - 4.500 uIU/mL  ?Urinalysis, Routine w reflex microscopic  ?Result Value Ref Range  ? Specific Gravity, UA >1.030 (H) 1.005 - 1.030  ? pH, UA 5.5 5.0 - 7.5  ? Color, UA Yellow Yellow  ? Appearance Ur Cloudy  (A) Clear  ? Leukocytes,UA Trace (A) Negative  ? Protein,UA Negative Negative/Trace  ? Glucose, UA Negative Negative  ? Ketones, UA Negative Negative  ? RBC, UA Trace (A) Negative  ? Bilirubin, UA Negative Negative  ? Urobilinogen, Ur 0.2 0.2 - 1.0 mg/dL  ? Nitrite, UA Negative Negative  ? Microscopic Examination See below:   ? ?   ?Assessment & Plan:  ? ?Problem List Items Addressed This Visit   ? ?  ? Other  ? Generalized abdominal pain - Primary  ?  Symptoms resolved with taking Doxepin daily at bedtime.  Continue to take nightly.  Keep follow up with GI for further evaluation.  Follow up in 1 month to ensure symptoms have still improved.  Will fill medication for patient when she needs a refill.   ?  ?  ?  ? ?Follow up plan: ?Return in about 1 month (around 03/21/2022) for Abdominal Pain. ? ?This visit was completed via MyChart due to the restrictions of the COVID-19 pandemic. All issues as above were discussed and addressed. Physical exam was done as above through visual confirmation on MyChart. If it was felt that the patient should be evaluated in the office, they were directed there. The patient verbally consented to this visit. ?Location of the patient: Home ?Location of the provider: Office ?Those involved with this call:  ?Provider: Jon Billings, NP ?CMA: NA ?Front Desk/Registration: Lynnell Catalan ?This encounter was conducted via video.  I spent 20 dedicated to the care of this patient on the date of this encounter to include previsit review of symptoms, medications, and follow up, face to face time with the patient, and post visit ordering of testing.  ? ? ? ? ? ?

## 2022-02-20 NOTE — Progress Notes (Signed)
2nd attempt to reach pt to schedule an appt.

## 2022-02-20 NOTE — Progress Notes (Signed)
LVM asking pt to call back to schedule f/u 

## 2022-02-24 NOTE — Progress Notes (Signed)
3rd attempt to reach pt by phone. I will send a mychart message to patient. ?

## 2022-03-03 ENCOUNTER — Ambulatory Visit (INDEPENDENT_AMBULATORY_CARE_PROVIDER_SITE_OTHER): Payer: BC Managed Care – PPO | Admitting: Gastroenterology

## 2022-03-03 ENCOUNTER — Encounter: Payer: Self-pay | Admitting: Gastroenterology

## 2022-03-03 VITALS — BP 105/73 | HR 76 | Temp 98.5°F | Ht 61.0 in | Wt 183.2 lb

## 2022-03-03 DIAGNOSIS — R748 Abnormal levels of other serum enzymes: Secondary | ICD-10-CM

## 2022-03-03 DIAGNOSIS — K5900 Constipation, unspecified: Secondary | ICD-10-CM

## 2022-03-03 NOTE — Progress Notes (Signed)
?  ?Wyline Mood MD, MRCP(U.K) ?[address]  ?[address]  ?[place], [place] [address]  ?Main: [phone]  ?Fax: [phone] ? ? ?Primary Care Physician: Larae Grooms, NP ? ?Primary Gastroenterologist:  Dr. Wyline Mood  ? ?Chief Complaint  ?Patient presents with  ? Follow-up  ? ? ?HPI: Kelly Pace is a 21 y.o. female ? ? ? ?Summary of history : ? ?Initially referred and seen on 01/07/2022 for abdominal pain and elevated liver enzymes.  The abdominal pain had been ongoing for over a year. Complains of radiating to right lower quadrant right abdomen worse with changing position less when she lays on that side.  Not affected by food intake.  She does not have a bowel movement every day as it once every few days.  Hard in nature.  Has noticed improvement with pain with passage of stools.  Denies any NSAID use.  Pain at times radiates to the back.  She denies any new medications that she has started occasional marijuana use no illegal drug use .  Has multiple tattoos which were professionally performed  No incarceration.  No Financial planner.  No history of blood transfusions.  She has lost some weight recently with change in diet. ? ?  ?10/28/2021 H. pylori stool antigen negative, CMP shows alkaline phosphatase elevated 125 acute hepatitis serology was negative.  Hemoglobin 13.1 g ?11/07/2021: Right upper quadrant ultrasound gallbladder and common bile duct abnormal features of hepatic steatosis noted ?  ? ?Interval history   01/07/2022-03/03/2022 ? ?01/07/2022 smooth muscle antibody positive at 21, calcium PTH normal.,  GGT normal alkaline phosphatase 126 with normal fractions TSH normal ANA negative ? ?02/12/2022 hemoglobin 13 g, CMP normal including alkaline phosphatase 811 ? ?Since her last visit she is doing very well.  No abdominal pain after increasing the fiber content in her diet.  No new complaints at this point of time. ? ?Current Outpatient Medications  ?Medication Sig Dispense Refill  ? doxepin  (SINEQUAN) 50 MG capsule Take 50 mg by mouth at bedtime as needed.    ? lamoTRIgine (LAMICTAL) 25 MG tablet Take 2 tablets (50 mg total) by mouth daily. Take 2 tablets daily (Patient not taking: Reported on 02/12/2022) 180 tablet 1  ? sertraline (ZOLOFT) 50 MG tablet Take 1 tablet (50 mg total) by mouth daily. (Patient not taking: Reported on 02/12/2022) 90 tablet 1  ? ?No current facility-administered medications for this visit.  ? ? ?Allergies as of 03/03/2022 - Review Complete 03/03/2022  ?Allergen Reaction Noted  ? Cashew nut oil Anaphylaxis 06/11/2018  ? Fish allergy Anaphylaxis 06/11/2018  ? Watermelon [citrullus vulgaris] Itching 08/24/2019  ? Nickel Rash 08/24/2019  ? ? ?ROS: ? ?General: Negative for anorexia, weight loss, fever, chills, fatigue, weakness. ?ENT: Negative for hoarseness, difficulty swallowing , nasal congestion. ?CV: Negative for chest pain, angina, palpitations, dyspnea on exertion, peripheral edema.  ?Respiratory: Negative for dyspnea at rest, dyspnea on exertion, cough, sputum, wheezing.  ?GI: See history of present illness. ?GU:  Negative for dysuria, hematuria, urinary incontinence, urinary frequency, nocturnal urination.  ?Endo: Negative for unusual weight change.  ?  ?Physical Examination: ? ? BP 105/73   Pulse 76   Temp 98.5 ?F (36.9 ?C) (Oral)   Ht 5\' 1"  (1.549 m)   Wt 183 lb 3.2 oz (83.1 kg)   BMI 34.62 kg/m?  ? ?General: Well-nourished, well-developed in no acute distress.  ?Eyes: No icterus. Conjunctivae pink. ?Neuro: Alert and oriented x 3.  Grossly intact. ?Skin: Warm  and dry, no jaundice.   ?Psych: Alert and cooperative, normal mood and affect. ? ? ?Imaging Studies: ?No results found. ? ?Assessment and Plan:  ? ?[name] is a 21 y.o. y/o female here to follow-up for mildly elevated alkaline phosphatase with normal GGT.  Repeat alkaline phosphatase was normal at 111.  I performed few labs last visit which were all negative except for mildly elevated smooth muscle  antibody.  Since the alkaline phosphatase is normalized and GGT is normal will not evaluate any further at this point of time.Hepatitis C antibody and B surface antigen negative.  Her abdominal pain also has resolved since her last visit with increasing the fiber in her diet indicating possible abdominal pain secondary to a degree of constipation. ? ?Plan ?1.  Since alkaline phosphatase has normalized we will recheck labs in 6 months time.  If elevated in the future we will recheck smooth muscle antibody as it was mildly positive and could be a false positive. ? ?2.  Continue high-fiber diet for constipation. ? ?Dr Wyline Mood  MD,MRCP [facility]) ?Follow up in 6 months with repeat LFTs to ensure they are stable ?

## 2022-03-14 ENCOUNTER — Telehealth: Payer: Self-pay | Admitting: Nurse Practitioner

## 2022-03-17 ENCOUNTER — Ambulatory Visit: Payer: BC Managed Care – PPO

## 2022-03-17 ENCOUNTER — Ambulatory Visit (INDEPENDENT_AMBULATORY_CARE_PROVIDER_SITE_OTHER): Payer: BC Managed Care – PPO

## 2022-03-17 DIAGNOSIS — Z23 Encounter for immunization: Secondary | ICD-10-CM

## 2022-03-25 NOTE — Progress Notes (Signed)
? ?BP 106/73   Pulse 74   Temp 98.4 ?F (36.9 ?C) (Oral)   Wt 176 lb 3.2 oz (79.9 kg)   LMP 03/17/2022 (Approximate)   SpO2 98%   BMI 33.29 kg/m?   ? ?Subjective:  ? ? Patient ID: Kelly Pace, female    DOB: [birth year], 21 y.o.   MRN: [id number] ? ?HPI: ?Kelly Pace is a 21 y.o. female ? ?Chief Complaint  ?Patient presents with  ? Abdominal Pain  ?  Follow up - Pt reports improvement. Pt states still experiencing emesis daily.  ? ?ABDOMINAL ISSUES ?Patient states she started taking the doxepin every night.  She is still waking up a night or early in the morning throwing up or very nauseous.  She doesn't have an appointment with GI until October.  She hasn't had an EGD. ? ?Relevant past medical, surgical, family and social history reviewed and updated as indicated. Interim medical history since our last visit reviewed. ?Allergies and medications reviewed and updated. ? ?Review of Systems  ?Gastrointestinal:  Positive for nausea and vomiting.  ? ?Per HPI unless specifically indicated above ? ?   ?Objective:  ?  ?BP 106/73   Pulse 74   Temp 98.4 ?F (36.9 ?C) (Oral)   Wt 176 lb 3.2 oz (79.9 kg)   LMP 03/17/2022 (Approximate)   SpO2 98%   BMI 33.29 kg/m?   ?Wt Readings from Last 3 Encounters:  ?03/26/22 176 lb 3.2 oz (79.9 kg)  ?03/03/22 183 lb 3.2 oz (83.1 kg)  ?02/12/22 185 lb 12.8 oz (84.3 kg)  ?  ?Physical Exam ?Vitals and nursing note reviewed.  ?Constitutional:   ?   General: She is not in acute distress. ?   Appearance: Normal appearance. She is normal weight. She is not ill-appearing, toxic-appearing or diaphoretic.  ?HENT:  ?   Head: Normocephalic.  ?   Right Ear: External ear normal.  ?   Left Ear: External ear normal.  ?   Nose: Nose normal.  ?   Mouth/Throat:  ?   Mouth: Mucous membranes are moist.  ?   Pharynx: Oropharynx is clear.  ?Eyes:  ?   General:     ?   Right eye: No discharge.     ?   Left eye: No discharge.  ?   Extraocular Movements: Extraocular movements intact.  ?    Conjunctiva/sclera: Conjunctivae normal.  ?   Pupils: Pupils are equal, round, and reactive to light.  ?Cardiovascular:  ?   Rate and Rhythm: Normal rate and regular rhythm.  ?   Heart sounds: No murmur heard. ?Pulmonary:  ?   Effort: Pulmonary effort is normal. No respiratory distress.  ?   Breath sounds: Normal breath sounds. No wheezing or rales.  ?Musculoskeletal:  ?   Cervical back: Normal range of motion and neck supple.  ?Skin: ?   General: Skin is warm and dry.  ?   Capillary Refill: Capillary refill takes less than 2 seconds.  ?Neurological:  ?   General: No focal deficit present.  ?   Mental Status: She is alert and oriented to person, place, and time. Mental status is at baseline.  ?Psychiatric:     ?   Mood and Affect: Mood normal.     ?   Behavior: Behavior normal.     ?   Thought Content: Thought content normal.     ?   Judgment: Judgment normal.  ? ? ?Results for orders placed or performed  in visit on 02/12/22  ?Microscopic Examination  ? Urine  ?Result Value Ref Range  ? WBC, UA 0-5 0 - 5 /hpf  ? RBC 0-2 0 - 2 /hpf  ? Epithelial Cells (non renal) 0-10 0 - 10 /hpf  ? Bacteria, UA None seen None seen/Few  ?Urine Culture  ? Specimen: Urine  ? UR  ?Result Value Ref Range  ? Urine Culture, Routine Final report   ? Organism ID, Bacteria Comment   ?CBC with Differential/Platelet  ?Result Value Ref Range  ? WBC 11.3 (H) 3.4 - 10.8 x10E3/uL  ? RBC 4.47 3.77 - 5.28 x10E6/uL  ? Hemoglobin 13.0 11.1 - 15.9 g/dL  ? Hematocrit 38.5 34.0 - 46.6 %  ? MCV 86 79 - 97 fL  ? MCH 29.1 26.6 - 33.0 pg  ? MCHC 33.8 31.5 - 35.7 g/dL  ? RDW 12.3 11.7 - 15.4 %  ? Platelets 399 150 - 450 x10E3/uL  ? Neutrophils 65 Not Estab. %  ? Lymphs 22 Not Estab. %  ? Monocytes 6 Not Estab. %  ? Eos 6 Not Estab. %  ? Basos 0 Not Estab. %  ? Neutrophils Absolute 7.4 (H) 1.4 - 7.0 x10E3/uL  ? Lymphocytes Absolute 2.5 0.7 - 3.1 x10E3/uL  ? Monocytes Absolute 0.6 0.1 - 0.9 x10E3/uL  ? EOS (ABSOLUTE) 0.7 (H) 0.0 - 0.4 x10E3/uL  ? Basophils  Absolute 0.0 0.0 - 0.2 x10E3/uL  ? Immature Granulocytes 1 Not Estab. %  ? Immature Grans (Abs) 0.1 0.0 - 0.1 x10E3/uL  ?Comprehensive metabolic panel  ?Result Value Ref Range  ? Glucose 86 70 - 99 mg/dL  ? BUN 9 6 - 20 mg/dL  ? Creatinine, Ser 0.60 0.57 - 1.00 mg/dL  ? eGFR 130 >59 mL/min/1.73  ? BUN/Creatinine Ratio 15 9 - 23  ? Sodium 139 134 - 144 mmol/L  ? Potassium 4.2 3.5 - 5.2 mmol/L  ? Chloride 103 96 - 106 mmol/L  ? CO2 21 20 - 29 mmol/L  ? Calcium 9.2 8.7 - 10.2 mg/dL  ? Total Protein 7.3 6.0 - 8.5 g/dL  ? Albumin 4.0 3.9 - 5.0 g/dL  ? Globulin, Total 3.3 1.5 - 4.5 g/dL  ? Albumin/Globulin Ratio 1.2 1.2 - 2.2  ? Bilirubin Total <0.2 0.0 - 1.2 mg/dL  ? Alkaline Phosphatase 111 44 - 121 IU/L  ? AST 16 0 - 40 IU/L  ? ALT 18 0 - 32 IU/L  ?Lipid panel  ?Result Value Ref Range  ? Cholesterol, Total 147 100 - 199 mg/dL  ? Triglycerides 68 0 - 149 mg/dL  ? HDL 46 >39 mg/dL  ? VLDL Cholesterol Cal 14 5 - 40 mg/dL  ? LDL Chol Calc (NIH) 87 0 - 99 mg/dL  ? Chol/HDL Ratio 3.2 0.0 - 4.4 ratio  ?TSH  ?Result Value Ref Range  ? TSH 0.955 0.450 - 4.500 uIU/mL  ?Urinalysis, Routine w reflex microscopic  ?Result Value Ref Range  ? Specific Gravity, UA >1.030 (H) 1.005 - 1.030  ? pH, UA 5.5 5.0 - 7.5  ? Color, UA Yellow Yellow  ? Appearance Ur Cloudy (A) Clear  ? Leukocytes,UA Trace (A) Negative  ? Protein,UA Negative Negative/Trace  ? Glucose, UA Negative Negative  ? Ketones, UA Negative Negative  ? RBC, UA Trace (A) Negative  ? Bilirubin, UA Negative Negative  ? Urobilinogen, Ur 0.2 0.2 - 1.0 mg/dL  ? Nitrite, UA Negative Negative  ? Microscopic Examination See below:   ? ?   ?  Assessment & Plan:  ? ?Problem List Items Addressed This Visit   ? ?  ? Other  ? Generalized abdominal pain - Primary  ?  Ongoing x several months.  Does not matter whether she takes the Doxepin or not.  Waking up in the middle of the night or early in the morning with nausea or vomiting.  Recommend she go back to see the GI doctor before October  for further evaluation and workup. ? ?  ?  ?  ? ?Follow up plan: ?Return in about 2 months (around 05/26/2022) for Abdominal Issues. ? ? ? ? ? ? ?

## 2022-03-26 ENCOUNTER — Ambulatory Visit: Payer: BC Managed Care – PPO | Admitting: Nurse Practitioner

## 2022-03-26 ENCOUNTER — Encounter: Payer: Self-pay | Admitting: Nurse Practitioner

## 2022-03-26 VITALS — BP 106/73 | HR 74 | Temp 98.4°F | Wt 176.2 lb

## 2022-03-26 DIAGNOSIS — R1084 Generalized abdominal pain: Secondary | ICD-10-CM | POA: Diagnosis not present

## 2022-03-26 NOTE — Assessment & Plan Note (Signed)
Ongoing x several months.  Does not matter whether she takes the Doxepin or not.  Waking up in the middle of the night or early in the morning with nausea or vomiting.  Recommend she go back to see the GI doctor before October for further evaluation and workup. ?

## 2022-03-27 MED ORDER — DOXEPIN HCL 50 MG PO CAPS: 50.0000 mg | ORAL_CAPSULE | Freq: Every evening | ORAL | 1 refills | Status: DC | PRN

## 2022-05-22 NOTE — Progress Notes (Deleted)
There were no vitals taken for this visit.   Subjective:    Patient ID: Kelly Pace, female    DOB: June 29, 2001, 21 y.o.   MRN: [id number]  HPI: Kelly Pace is a 21 y.o. female  No chief complaint on file.  ABDOMINAL ISSUES Patient states she started taking the doxepin every night.  She is still waking up a night or early in the morning throwing up or very nauseous.  She doesn't have an appointment with GI until October.  She hasn't had an EGD.  Relevant past medical, surgical, family and social history reviewed and updated as indicated. Interim medical history since our last visit reviewed. Allergies and medications reviewed and updated.  Review of Systems  Gastrointestinal:  Positive for nausea and vomiting.   Per HPI unless specifically indicated above     Objective:    There were no vitals taken for this visit.  Wt Readings from Last 3 Encounters:  03/26/22 176 lb 3.2 oz (79.9 kg)  03/03/22 183 lb 3.2 oz (83.1 kg)  02/12/22 185 lb 12.8 oz (84.3 kg)    Physical Exam Vitals and nursing note reviewed.  Constitutional:      General: She is not in acute distress.    Appearance: Normal appearance. She is normal weight. She is not ill-appearing, toxic-appearing or diaphoretic.  HENT:     Head: Normocephalic.     Right Ear: External ear normal.     Left Ear: External ear normal.     Nose: Nose normal.     Mouth/Throat:     Mouth: Mucous membranes are moist.     Pharynx: Oropharynx is clear.  Eyes:     General:        Right eye: No discharge.        Left eye: No discharge.     Extraocular Movements: Extraocular movements intact.     Conjunctiva/sclera: Conjunctivae normal.     Pupils: Pupils are equal, round, and reactive to light.  Cardiovascular:     Rate and Rhythm: Normal rate and regular rhythm.     Heart sounds: No murmur heard. Pulmonary:     Effort: Pulmonary effort is normal. No respiratory distress.     Breath sounds: Normal breath sounds. No wheezing  or rales.  Musculoskeletal:     Cervical back: Normal range of motion and neck supple.  Skin:    General: Skin is warm and dry.     Capillary Refill: Capillary refill takes less than 2 seconds.  Neurological:     General: No focal deficit present.     Mental Status: She is alert and oriented to person, place, and time. Mental status is at baseline.  Psychiatric:        Mood and Affect: Mood normal.        Behavior: Behavior normal.        Thought Content: Thought content normal.        Judgment: Judgment normal.    Results for orders placed or performed in visit on 02/12/22  Microscopic Examination   Urine  Result Value Ref Range   WBC, UA 0-5 0 - 5 /hpf   RBC, Urine 0-2 0 - 2 /hpf   Epithelial Cells (non renal) 0-10 0 - 10 /hpf   Bacteria, UA None seen None seen/Few  Urine Culture   Specimen: Urine   UR  Result Value Ref Range   Urine Culture, Routine Final report    Organism ID, Bacteria Comment  CBC with Differential/Platelet  Result Value Ref Range   WBC 11.3 (H) 3.4 - 10.8 x10E3/uL   RBC 4.47 3.77 - 5.28 x10E6/uL   Hemoglobin 13.0 11.1 - 15.9 g/dL   Hematocrit 38.5 34.0 - 46.6 %   MCV 86 79 - 97 fL   MCH 29.1 26.6 - 33.0 pg   MCHC 33.8 31.5 - 35.7 g/dL   RDW 12.3 11.7 - 15.4 %   Platelets 399 150 - 450 x10E3/uL   Neutrophils 65 Not Estab. %   Lymphs 22 Not Estab. %   Monocytes 6 Not Estab. %   Eos 6 Not Estab. %   Basos 0 Not Estab. %   Neutrophils Absolute 7.4 (H) 1.4 - 7.0 x10E3/uL   Lymphocytes Absolute 2.5 0.7 - 3.1 x10E3/uL   Monocytes Absolute 0.6 0.1 - 0.9 x10E3/uL   EOS (ABSOLUTE) 0.7 (H) 0.0 - 0.4 x10E3/uL   Basophils Absolute 0.0 0.0 - 0.2 x10E3/uL   Immature Granulocytes 1 Not Estab. %   Immature Grans (Abs) 0.1 0.0 - 0.1 x10E3/uL  Comprehensive metabolic panel  Result Value Ref Range   Glucose 86 70 - 99 mg/dL   BUN 9 6 - 20 mg/dL   Creatinine, Ser 0.60 0.57 - 1.00 mg/dL   eGFR 130 >59 mL/min/1.73   BUN/Creatinine Ratio 15 9 - 23   Sodium  139 134 - 144 mmol/L   Potassium 4.2 3.5 - 5.2 mmol/L   Chloride 103 96 - 106 mmol/L   CO2 21 20 - 29 mmol/L   Calcium 9.2 8.7 - 10.2 mg/dL   Total Protein 7.3 6.0 - 8.5 g/dL   Albumin 4.0 3.9 - 5.0 g/dL   Globulin, Total 3.3 1.5 - 4.5 g/dL   Albumin/Globulin Ratio 1.2 1.2 - 2.2   Bilirubin Total <0.2 0.0 - 1.2 mg/dL   Alkaline Phosphatase 111 44 - 121 IU/L   AST 16 0 - 40 IU/L   ALT 18 0 - 32 IU/L  Lipid panel  Result Value Ref Range   Cholesterol, Total 147 100 - 199 mg/dL   Triglycerides 68 0 - 149 mg/dL   HDL 46 >39 mg/dL   VLDL Cholesterol Cal 14 5 - 40 mg/dL   LDL Chol Calc (NIH) 87 0 - 99 mg/dL   Chol/HDL Ratio 3.2 0.0 - 4.4 ratio  TSH  Result Value Ref Range   TSH 0.955 0.450 - 4.500 uIU/mL  Urinalysis, Routine w reflex microscopic  Result Value Ref Range   Specific Gravity, UA >1.030 (H) 1.005 - 1.030   pH, UA 5.5 5.0 - 7.5   Color, UA Yellow Yellow   Appearance Ur Cloudy (A) Clear   Leukocytes,UA Trace (A) Negative   Protein,UA Negative Negative/Trace   Glucose, UA Negative Negative   Ketones, UA Negative Negative   RBC, UA Trace (A) Negative   Bilirubin, UA Negative Negative   Urobilinogen, Ur 0.2 0.2 - 1.0 mg/dL   Nitrite, UA Negative Negative   Microscopic Examination See below:       Assessment & Plan:   Problem List Items Addressed This Visit   None    Follow up plan: No follow-ups on file.

## 2022-05-26 ENCOUNTER — Ambulatory Visit: Payer: BC Managed Care – PPO | Admitting: Nurse Practitioner

## 2022-08-15 ENCOUNTER — Ambulatory Visit: Payer: BC Managed Care – PPO

## 2022-09-04 ENCOUNTER — Ambulatory Visit: Payer: BC Managed Care – PPO | Admitting: Gastroenterology

## 2022-09-08 ENCOUNTER — Ambulatory Visit: Payer: Self-pay

## 2022-09-22 ENCOUNTER — Ambulatory Visit: Payer: BC Managed Care – PPO | Admitting: Gastroenterology

## 2022-09-24 ENCOUNTER — Ambulatory Visit: Payer: Self-pay

## 2022-09-25 ENCOUNTER — Other Ambulatory Visit: Payer: Self-pay | Admitting: Nurse Practitioner

## 2022-09-25 NOTE — Telephone Encounter (Signed)
Requested medications are due for refill today.  yes  Requested medications are on the active medications list.  yes  Last refill. 03/27/2022 #90 1 rf  Future visit scheduled.   no  Notes to clinic.  Refill both passed and failed protocol. Please review for refill.    Requested Prescriptions  Pending Prescriptions Disp Refills   doxepin (SINEQUAN) 50 MG capsule [Pharmacy Med Name: DOXEPIN 50 MG CAPSULE] 90 capsule 1    Sig: Take 1 capsule (50 mg total) by mouth at bedtime as needed.     Psychiatry:  Antidepressants - Heterocyclics (TCAs) Failed - 09/25/2022  2:43 AM      Failed - Valid encounter within last 6 months    Recent Outpatient Visits           6 months ago Generalized abdominal pain   [facility] Larae Grooms, NP   7 months ago Generalized abdominal pain   [facility] Larae Grooms, NP   7 months ago Annual physical exam   [facility] Larae Grooms, NP   11 months ago Bipolar 2 disorder [facility])   [facility] Larae Grooms, NP             Psychiatry:  Antidepressants - Heterocyclics (TCAs) - doxepin Passed - 09/25/2022  2:43 AM      Passed - Valid encounter within last 12 months    Recent Outpatient Visits           6 months ago Generalized abdominal pain   [facility] Larae Grooms, NP   7 months ago Generalized abdominal pain   [facility] Larae Grooms, NP   7 months ago Annual physical exam   [facility] Larae Grooms, NP   11 months ago Bipolar 2 disorder [facility])   [facility] Larae Grooms, NP

## 2022-10-23 ENCOUNTER — Other Ambulatory Visit: Payer: Self-pay | Admitting: Nurse Practitioner

## 2022-10-23 NOTE — Telephone Encounter (Signed)
Requested medications are due for refill today.  Too soon  Requested medications are on the active medications list.  yes  Last refill. 11/92023  Future visit scheduled.   no  Notes to clinic.  Protocol says medication refill both passed and failed protocol.    Requested Prescriptions  Pending Prescriptions Disp Refills   doxepin (SINEQUAN) 50 MG capsule [Pharmacy Med Name: DOXEPIN 50 MG CAPSULE] 90 capsule 1    Sig: TAKE 1 CAPSULE (50 MG TOTAL) BY MOUTH AT BEDTIME AS NEEDED.     Psychiatry:  Antidepressants - Heterocyclics (TCAs) Failed - 10/23/2022  1:31 PM      Failed - Valid encounter within last 6 months    Recent Outpatient Visits           7 months ago Generalized abdominal pain   [facility] Larae Grooms, NP   8 months ago Generalized abdominal pain   [facility] Larae Grooms, NP   8 months ago Annual physical exam   [facility] Larae Grooms, NP   12 months ago Bipolar 2 disorder [facility])   [facility] Larae Grooms, NP             Psychiatry:  Antidepressants - Heterocyclics (TCAs) - doxepin Passed - 10/23/2022  1:31 PM      Passed - Valid encounter within last 12 months    Recent Outpatient Visits           7 months ago Generalized abdominal pain   [facility] Larae G[facility], NP   8 months ago Generalized abdominal pain   [facility] Larae Grooms, NP   8 months ago Annual physical exam   [facility] Larae Grooms, NP   12 months ago Bipolar 2 disorder [facility])   [facility] Larae Grooms, NP

## 2022-11-27 ENCOUNTER — Other Ambulatory Visit: Payer: Self-pay | Admitting: Nurse Practitioner

## 2022-11-27 NOTE — Telephone Encounter (Signed)
Requested Prescriptions  Pending Prescriptions Disp Refills   doxepin (SINEQUAN) 50 MG capsule [Pharmacy Med Name: DOXEPIN 50 MG CAPSULE] 30 capsule 2    Sig: TAKE 1 CAPSULE (50 MG TOTAL) BY MOUTH AT BEDTIME AS NEEDED.     Psychiatry:  Antidepressants - Heterocyclics (TCAs) Failed - 11/27/2022  1:39 AM      Failed - Valid encounter within last 6 months    Recent Outpatient Visits           8 months ago Generalized abdominal pain   [place], NP   9 months ago Generalized abdominal pain   [facility] Jon Billings, NP   9 months ago Annual physical exam   [facility] Jon Billings, NP   1 year ago Bipolar 2 disorder [facility])   [facility] Jon Billings, NP             Psychiatry:  Antidepressants - Heterocyclics (TCAs) - doxepin Passed - 11/27/2022  1:39 AM      Passed - Valid encounter within last 12 months    Recent Outpatient Visits           8 months ago Generalized abdominal pain   [place], NP   9 months ago Generalized abdominal pain   [facility] Jon Billings, NP   9 months ago Annual physical exam   [facility] Jon Billings, NP   1 year ago Bipolar 2 disorder [facility])   [facility] Jon Billings, NP

## 2022-12-22 ENCOUNTER — Other Ambulatory Visit: Payer: Self-pay | Admitting: Nurse Practitioner

## 2022-12-23 NOTE — Telephone Encounter (Signed)
Requested Prescriptions  Pending Prescriptions Disp Refills   doxepin (SINEQUAN) 50 MG capsule [Pharmacy Med Name: DOXEPIN 50 MG CAPSULE] 90 capsule     Sig: TAKE 1 CAPSULE (50 MG TOTAL) BY MOUTH AT BEDTIME AS NEEDED.     Psychiatry:  Antidepressants - Heterocyclics (TCAs) Failed - 12/22/2022 12:33 PM      Failed - Valid encounter within last 6 months    Recent Outpatient Visits           9 months ago Generalized abdominal pain   [place], NP   10 months ago Generalized abdominal pain   [place] Jon Billings, NP   10 months ago Annual physical exam   [place] Jon Billings, NP   1 year ago Bipolar 2 disorder [facility])   [place] Jon Billings, NP             Psychiatry:  Antidepressants - Heterocyclics (TCAs) - doxepin Passed - 12/22/2022 12:33 PM      Passed - Valid encounter within last 12 months    Recent Outpatient Visits           9 months ago Generalized abdominal pain   [place], Karen, NP   10 months ago Generalized abdominal pain   [place] Jon Billings, NP   10 months ago Annual physical exam   [place] Jon Billings, NP   1 year ago Bipolar 2 disorder [facility])   [place] Jon Billings, NP

## 2023-01-19 ENCOUNTER — Other Ambulatory Visit: Payer: Self-pay | Admitting: Nurse Practitioner

## 2023-01-19 DIAGNOSIS — R1084 Generalized abdominal pain: Secondary | ICD-10-CM

## 2023-01-19 DIAGNOSIS — F3181 Bipolar II disorder: Secondary | ICD-10-CM

## 2023-01-19 DIAGNOSIS — R112 Nausea with vomiting, unspecified: Secondary | ICD-10-CM

## 2023-03-23 ENCOUNTER — Other Ambulatory Visit: Payer: Self-pay | Admitting: Nurse Practitioner

## 2023-03-24 NOTE — Telephone Encounter (Signed)
Requested medication (s) are due for refill today: yes  Requested medication (s) are on the active medication list: yes  Last refill:  12/23/22  Future visit scheduled: no  Notes to clinic:  Unable to refill per protocol, appointment needed. Routing for approval.     Requested Prescriptions  Pending Prescriptions Disp Refills   doxepin (SINEQUAN) 50 MG capsule [Pharmacy Med Name: DOXEPIN 50 MG CAPSULE] 90 capsule 0    Sig: TAKE 1 CAPSULE (50 MG TOTAL) BY MOUTH AT BEDTIME AS NEEDED.     Psychiatry:  Antidepressants - Heterocyclics (TCAs) Failed - 03/23/2023  1:32 PM      Failed - Valid encounter within last 6 months    Recent Outpatient Visits           12 months ago Generalized abdominal pain   [place] [facility] Larae Grooms, NP   1 year ago Generalized abdominal pain   [facility] Larae Grooms, NP   1 year ago Annual physical exam   [facility] Larae Grooms, NP   1 year ago Bipolar 2 disorder [facility])   [place] [facility] Larae Grooms, NP             Psychiatry:  Antidepressants - Heterocyclics (TCAs) - doxepin Failed - 03/23/2023  1:32 PM      Failed - Valid encounter within last 12 months    Recent Outpatient Visits           12 months ago Generalized abdominal pain   [place] [facility] Larae Grooms, NP   1 year ago Generalized abdominal pain   [place] [facility] Larae Grooms, NP   1 year ago Annual physical exam   [place] [facility] Larae Grooms, NP   1 year ago Bipolar 2 disorder [facility])    [facility] Larae Grooms, NP

## 2023-03-24 NOTE — Telephone Encounter (Signed)
Patient is overdue for an appointment, also due for CPE. Please call to schedule cpe then route to provider for refill.

## 2023-03-25 ENCOUNTER — Other Ambulatory Visit: Payer: Self-pay | Admitting: Nurse Practitioner

## 2023-03-25 NOTE — Telephone Encounter (Signed)
LVM stating this information asked that patient call office back.  Will try again later.

## 2023-03-26 NOTE — Telephone Encounter (Signed)
LVM stating this information asked that patient call office back.  Will try again later. 

## 2023-03-27 IMAGING — US US ABDOMEN LIMITED
1 series · 14 of 25 positions shown · non-contrast
Comparison: None.

CLINICAL DATA: Liver enzymes

EXAM:
ULTRASOUND ABDOMEN LIMITED RIGHT UPPER QUADRANT

[Series 1: us abdomen limited · 0.23mm/px · 14 of 35 slices shown]
[im 1/35]
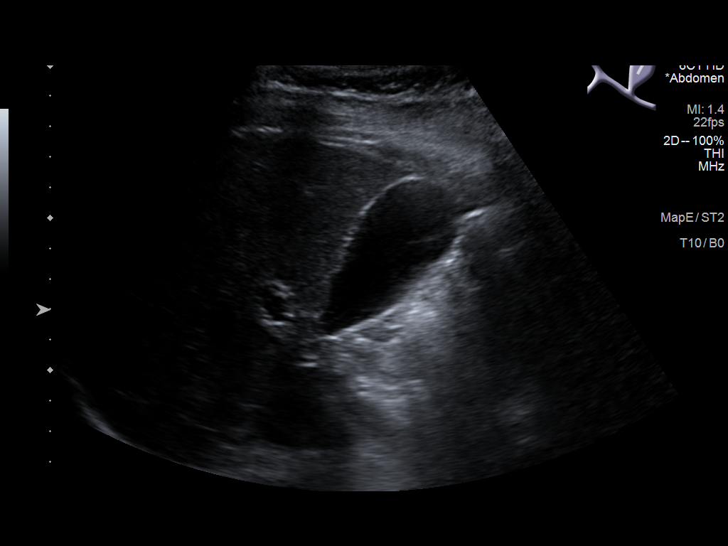
[im 3/35]
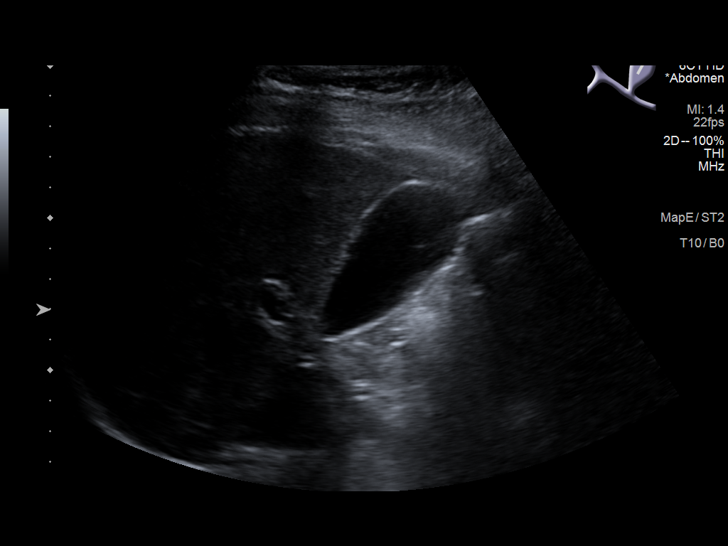
[im 6/35]
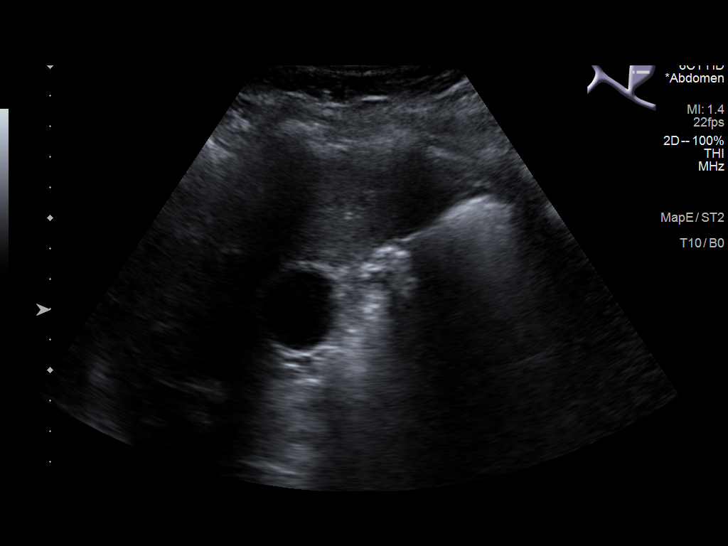
[im 9/35]
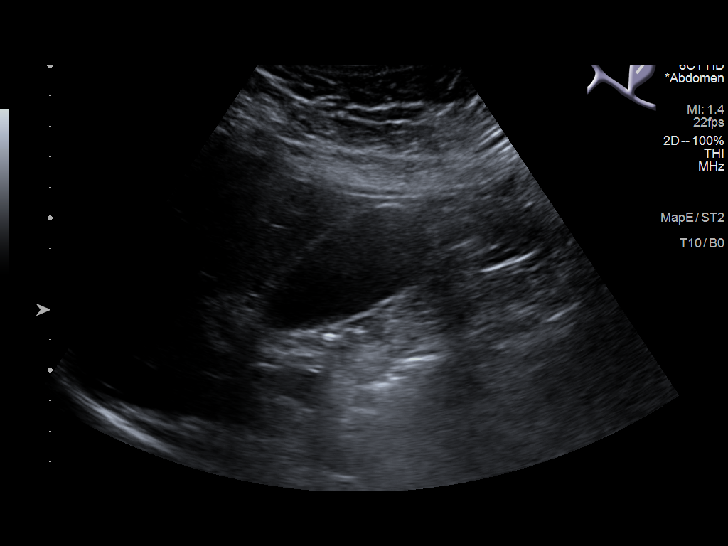
[im 12/35]
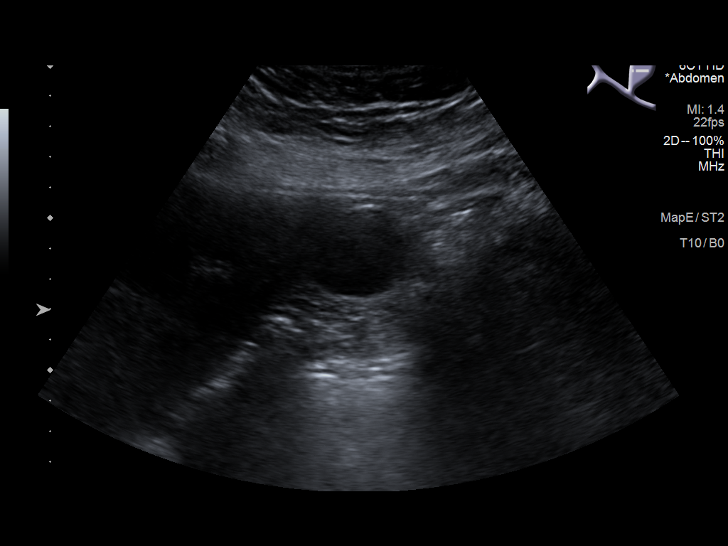
[im 13/35]
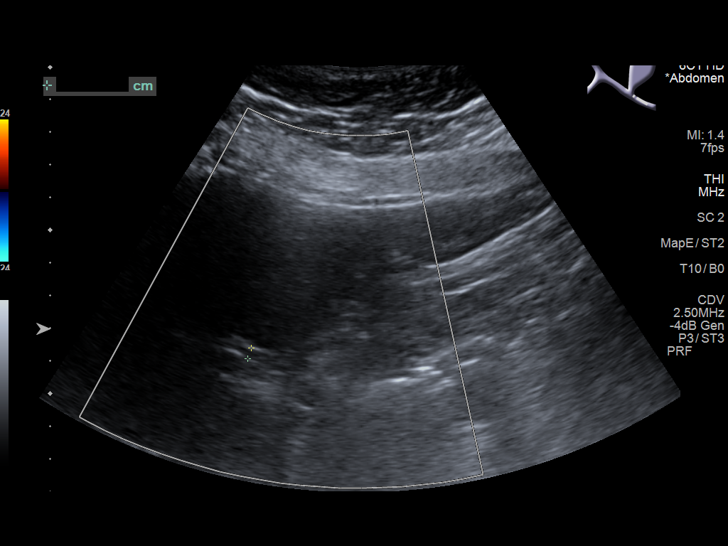
[im 16/35]
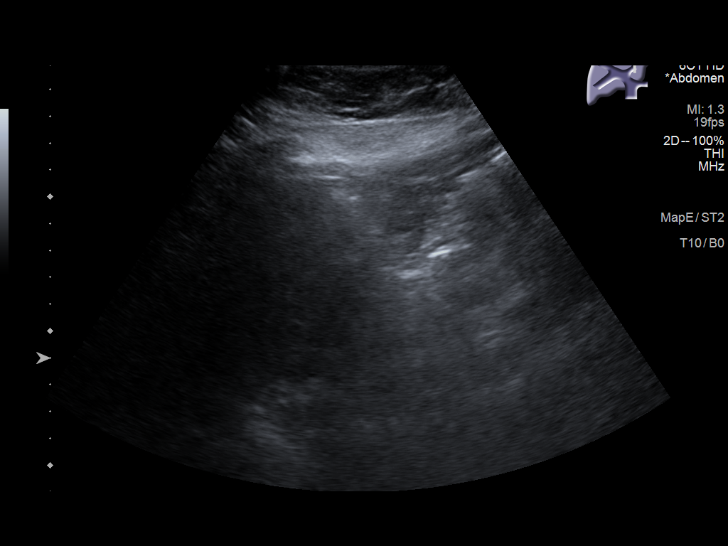
[im 19/35]
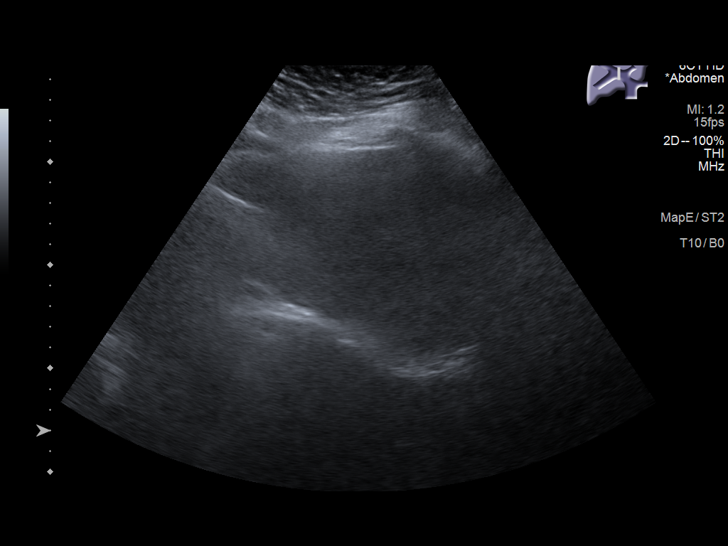
[im 22/35]
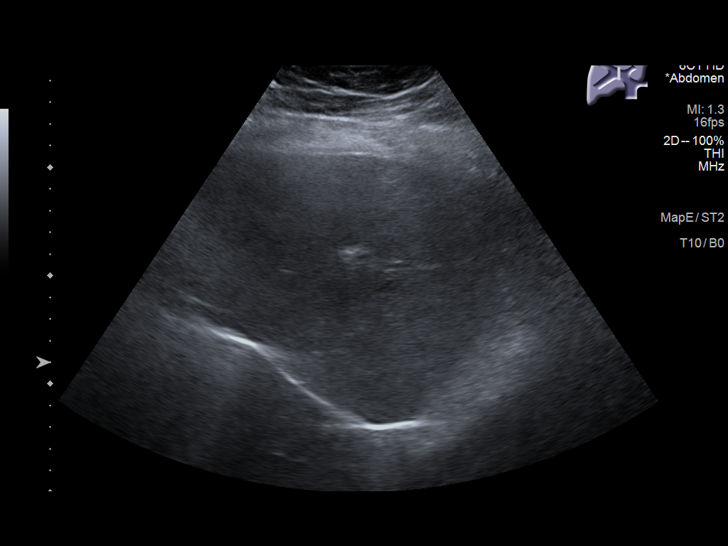
[im 23/35]
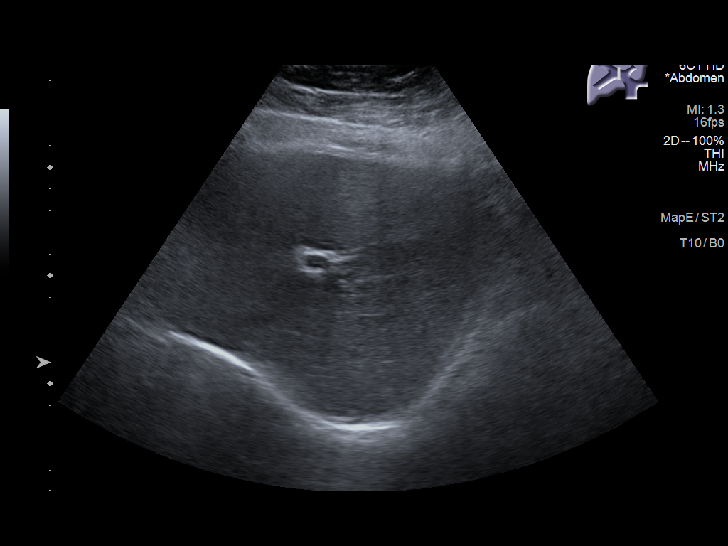
[im 26/35]
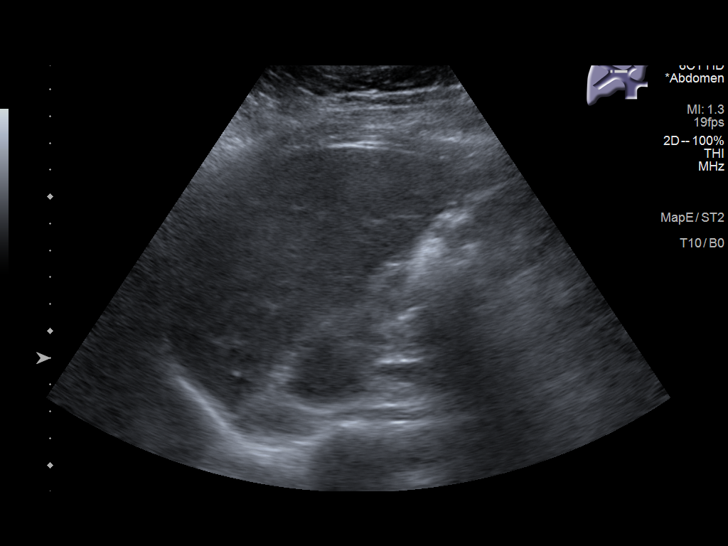
[im 29/35]
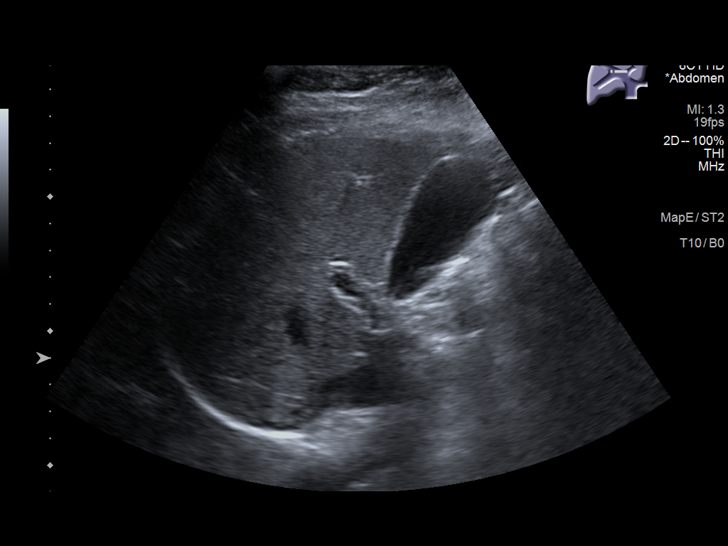
[im 32/35]
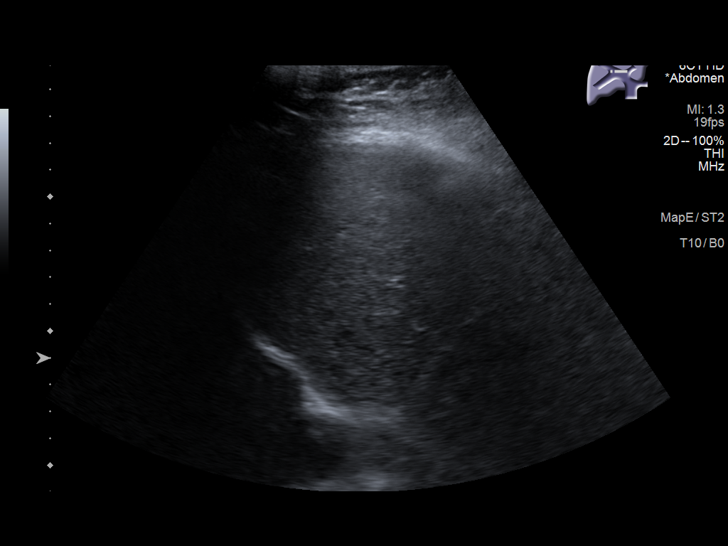
[im 35/35]
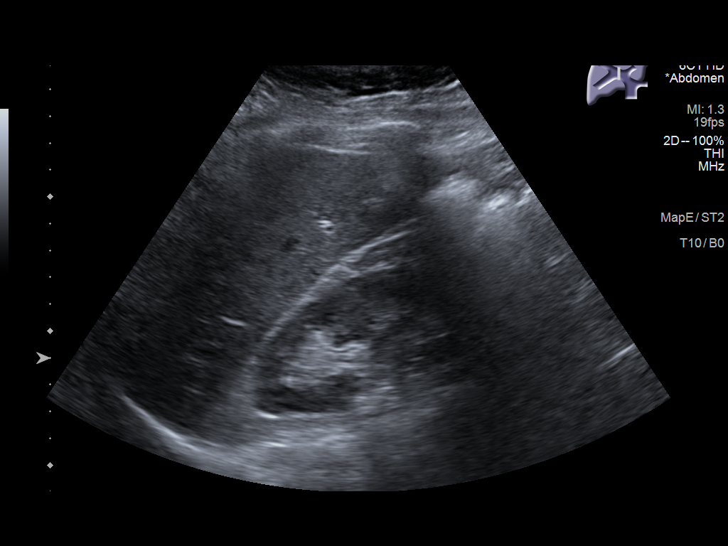

[14 of 25 positions shown; findings below may reference images not displayed]

FINDINGS: Gallbladder:

No gallstones or wall thickening visualized. No sonographic Murphy
sign noted by sonographer.

Common bile duct:

Diameter: 3.4 mm

Liver:

Diffuse increased echogenicity in the liver with no focal portal
vein is patent on color Doppler imaging with normal direction of
blood flow towards the liver.

Other: None.
IMPRESSION: 1. The gallbladder and common bile duct are normal
2. Diffuse increased echogenicity in the liver is a finding that is
often seen with hepatic steatosis

## 2023-03-27 NOTE — Telephone Encounter (Signed)
Pt has an appointment on 03/30/2023 @ 2:20 pm.

## 2023-03-30 ENCOUNTER — Other Ambulatory Visit (HOSPITAL_COMMUNITY)
Admission: RE | Admit: 2023-03-30 | Discharge: 2023-03-30 | Disposition: A | Discharge status: Home / Self Care | Payer: BC Managed Care – PPO | Source: Ambulatory Visit | Attending: Nurse Practitioner | Admitting: Nurse Practitioner

## 2023-03-30 ENCOUNTER — Ambulatory Visit (INDEPENDENT_AMBULATORY_CARE_PROVIDER_SITE_OTHER): Payer: BC Managed Care – PPO | Admitting: Nurse Practitioner

## 2023-03-30 ENCOUNTER — Encounter: Payer: Self-pay | Admitting: Nurse Practitioner

## 2023-03-30 VITALS — BP 114/77 | HR 80 | Temp 98.1°F | Ht 61.81 in | Wt 175.4 lb

## 2023-03-30 DIAGNOSIS — R112 Nausea with vomiting, unspecified: Secondary | ICD-10-CM

## 2023-03-30 DIAGNOSIS — Z0000 Encounter for general adult medical examination without abnormal findings: Secondary | ICD-10-CM | POA: Diagnosis not present

## 2023-03-30 DIAGNOSIS — F3181 Bipolar II disorder: Secondary | ICD-10-CM

## 2023-03-30 DIAGNOSIS — R1084 Generalized abdominal pain: Secondary | ICD-10-CM

## 2023-03-30 DIAGNOSIS — Z114 Encounter for screening for human immunodeficiency virus [HIV]: Secondary | ICD-10-CM

## 2023-03-30 DIAGNOSIS — Z136 Encounter for screening for cardiovascular disorders: Secondary | ICD-10-CM

## 2023-03-30 LAB — URINALYSIS, ROUTINE W REFLEX MICROSCOPIC
Bilirubin, UA: NEGATIVE
Glucose, UA: NEGATIVE
Leukocytes,UA: NEGATIVE
Nitrite, UA: NEGATIVE
Protein,UA: NEGATIVE
RBC, UA: NEGATIVE
Specific Gravity, UA: 1.025 (ref 1.005–1.030)
Urobilinogen, Ur: 1 mg/dL (ref 0.2–1.0)
pH, UA: 6.5 (ref 5.0–7.5)

## 2023-03-30 MED ORDER — SERTRALINE HCL 50 MG PO TABS: 50.0000 mg | ORAL_TABLET | Freq: Every day | ORAL | 1 refills | Status: DC

## 2023-03-30 MED ORDER — LAMOTRIGINE 25 MG PO TABS
ORAL_TABLET | ORAL | 1 refills | Status: DC
Start: 2023-03-30 — End: 2023-10-21

## 2023-03-30 MED ORDER — DOXEPIN HCL 50 MG PO CAPS: 50.0000 mg | ORAL_CAPSULE | Freq: Every evening | ORAL | 1 refills | Status: DC | PRN

## 2023-03-30 NOTE — Progress Notes (Signed)
BP 114/77   Pulse 80   Temp 98.1 F (36.7 C) (Oral)   Ht 5' 1.81" (1.57 m)   Wt 175 lb 6.4 oz (79.6 kg)   LMP 03/20/2023 (Exact Date)   SpO2 99%   BMI 32.28 kg/m    Subjective:    Patient ID: Kelly Pace, female    DOB: [birth year], 22 y.o.   MRN: [id number]  HPI: Kelly Pace is a 22 y.o. female presenting on 03/30/2023 for comprehensive medical examination. Current medical complaints include: Abdominal pain  She currently lives with: Menopausal Symptoms: no  MOOD Patient states she feels like her mood has been good. She is taking the Doxepin, Lamictal 25mg  and Zoloft 50mg .  She feels like she is doing well.  She was stressed about school that just ended.  Denies SI.    Depression Screen done today and results listed below:     03/30/2023    2:27 PM 03/26/2022   10:16 AM 02/12/2022   10:56 AM 10/28/2021    2:13 PM 06/25/2021    3:15 PM  Depression screen PHQ 2/9  Decreased Interest 2 1 2  0   Down, Depressed, Hopeless 1 1 1 1    PHQ - 2 Score 3 2 3 1    Altered sleeping 2 3 3 3    Tired, decreased energy 1 2 1  0   Change in appetite 3 3 2 3    Feeling bad or failure about yourself  0 1 0 0   Trouble concentrating 2 0 0 1   Moving slowly or fidgety/restless 2 1 0 0   Suicidal thoughts 0 1 0 0   PHQ-9 Score 13 13 9 8    Difficult doing work/chores Somewhat difficult Somewhat difficult Somewhat difficult Very difficult      Information is confidential and restricted. Go to Review Flowsheets to unlock data.    The patient does not have a history of falls. I did complete a risk assessment for falls. A plan of care for falls was documented.   Past Medical History:  Past Medical History:  Diagnosis Date   Anxiety    Asthma    Bipolar 2 disorder (HCC)    Eczema    Wears contact lenses     Surgical History:  Past Surgical History:  Procedure Laterality Date   NO PAST SURGERIES     TONSILLECTOMY N/A 08/31/2019   Procedure: TONSILLECTOMY;  Surgeon: Bud Face,  MD;  Location: [facility];  Service: ENT;  Laterality: N/A;    Medications:  No current outpatient medications on file prior to visit.   No current facility-administered medications on file prior to visit.    Allergies:  Allergies  Allergen Reactions   Cashew Nut Oil Anaphylaxis   Fish Allergy Anaphylaxis    ALL seafood   Watermelon [Citrullus Vulgaris] Itching    tongue   Nickel Rash    Social History:  Social History   Socioeconomic History   Marital status: Single    Spouse name: Not on file   Number of children: Not on file   Years of education: Not on file   Highest education level: Not on file  Occupational History   Not on file  Tobacco Use   Smoking status: Never   Smokeless tobacco: Never   Tobacco comments:    socially in HS  Vaping Use   Vaping Use: Never used  Substance and Sexual Activity   Alcohol use: Never   Drug use: Yes  Types: Marijuana    Comment: on occasion   Sexual activity: Yes  Other Topics Concern   Not on file  Social History Narrative   Not on file   Social Determinants of Health   Financial Resource Strain: Not on file  Food Insecurity: Not on file  Transportation Needs: Not on file  Physical Activity: Not on file  Stress: Not on file  Social Connections: Not on file  Intimate Partner Violence: Not on file   Social History   Tobacco Use  Smoking Status Never  Smokeless Tobacco Never  Tobacco Comments   socially in HS   Social History   Substance and Sexual Activity  Alcohol Use Never    Family History:  Family History  Problem Relation Age of Onset   Migraines Father    Bipolar disorder Maternal Uncle    Heart disease Paternal Aunt    Breast cancer Maternal Grandmother     Past medical history, surgical history, medications, allergies, family history and social history reviewed with patient today and changes made to appropriate areas of the chart.   Review of Systems  Psychiatric/Behavioral:   Positive for depression. Negative for suicidal ideas. The patient is nervous/anxious.    All other ROS negative except what is listed above and in the HPI.      Objective:    BP 114/77   Pulse 80   Temp 98.1 F (36.7 C) (Oral)   Ht 5' 1.81" (1.57 m)   Wt 175 lb 6.4 oz (79.6 kg)   LMP 03/20/2023 (Exact Date)   SpO2 99%   BMI 32.28 kg/m   Wt Readings from Last 3 Encounters:  03/30/23 175 lb 6.4 oz (79.6 kg)  03/26/22 176 lb 3.2 oz (79.9 kg)  03/03/22 183 lb 3.2 oz (83.1 kg)    Physical Exam Vitals and nursing note reviewed. Exam conducted with a chaperone present Wilhemena Durie, CMA).  Constitutional:      General: She is awake. She is not in acute distress.    Appearance: Normal appearance. She is well-developed. She is not ill-appearing.  HENT:     Head: Normocephalic and atraumatic.     Right Ear: Hearing, tympanic membrane, ear canal and external ear normal. No drainage.     Left Ear: Hearing, tympanic membrane, ear canal and external ear normal. No drainage.     Nose: Nose normal.     Right Sinus: No maxillary sinus tenderness or frontal sinus tenderness.     Left Sinus: No maxillary sinus tenderness or frontal sinus tenderness.     Mouth/Throat:     Mouth: Mucous membranes are moist.     Pharynx: Oropharynx is clear. Uvula midline. No pharyngeal swelling, oropharyngeal exudate or posterior oropharyngeal erythema.  Eyes:     General: Lids are normal.        Right eye: No discharge.        Left eye: No discharge.     Extraocular Movements: Extraocular movements intact.     Conjunctiva/sclera: Conjunctivae normal.     Pupils: Pupils are equal, round, and reactive to light.     Visual Fields: Right eye visual fields normal and left eye visual fields normal.  Neck:     Thyroid: No thyromegaly.     Vascular: No carotid bruit.     Trachea: Trachea normal.  Cardiovascular:     Rate and Rhythm: Normal rate and regular rhythm.     Heart sounds: Normal heart sounds. No  murmur heard.  No gallop.  Pulmonary:     Effort: Pulmonary effort is normal. No accessory muscle usage or respiratory distress.     Breath sounds: Normal breath sounds.  Chest:  Breasts:    Right: Normal.     Left: Normal.  Abdominal:     General: Bowel sounds are normal.     Palpations: Abdomen is soft. There is no hepatomegaly or splenomegaly.     Tenderness: There is no abdominal tenderness.  Genitourinary:    Vagina: Normal.     Cervix: Normal.     Adnexa: Right adnexa normal and left adnexa normal.  Musculoskeletal:        General: Normal range of motion.     Cervical back: Normal range of motion and neck supple.     Right lower leg: No edema.     Left lower leg: No edema.  Lymphadenopathy:     Head:     Right side of head: No submental, submandibular, tonsillar, preauricular or posterior auricular adenopathy.     Left side of head: No submental, submandibular, tonsillar, preauricular or posterior auricular adenopathy.     Cervical: No cervical adenopathy.     Upper Body:     Right upper body: No supraclavicular, axillary or pectoral adenopathy.     Left upper body: No supraclavicular, axillary or pectoral adenopathy.  Skin:    General: Skin is warm and dry.     Capillary Refill: Capillary refill takes less than 2 seconds.     Findings: No rash.  Neurological:     Mental Status: She is alert and oriented to person, place, and time.     Gait: Gait is intact.  Psychiatric:        Attention and Perception: Attention normal.        Mood and Affect: Mood normal.        Speech: Speech normal.        Behavior: Behavior normal. Behavior is cooperative.        Thought Content: Thought content normal.        Judgment: Judgment normal.     Results for orders placed or performed in visit on 02/12/22  Microscopic Examination   Urine  Result Value Ref Range   WBC, UA 0-5 0 - 5 /hpf   RBC, Urine 0-2 0 - 2 /hpf   Epithelial Cells (non renal) 0-10 0 - 10 /hpf   Bacteria,  UA None seen None seen/Few  Urine Culture   Specimen: Urine   UR  Result Value Ref Range   Urine Culture, Routine Final report    Organism ID, Bacteria Comment   CBC with Differential/Platelet  Result Value Ref Range   WBC 11.3 (H) 3.4 - 10.8 x10E3/uL   RBC 4.47 3.77 - 5.28 x10E6/uL   Hemoglobin 13.0 11.1 - 15.9 g/dL   Hematocrit 16.1 09.6 - 46.6 %   MCV 86 79 - 97 fL   MCH 29.1 26.6 - 33.0 pg   MCHC 33.8 31.5 - 35.7 g/dL   RDW 04.5 40.9 - 81.1 %   Platelets 399 150 - 450 x10E3/uL   Neutrophils 65 Not Estab. %   Lymphs 22 Not Estab. %   Monocytes 6 Not Estab. %   Eos 6 Not Estab. %   Basos 0 Not Estab. %   Neutrophils Absolute 7.4 (H) 1.4 - 7.0 x10E3/uL   Lymphocytes Absolute 2.5 0.7 - 3.1 x10E3/uL   Monocytes Absolute 0.6 0.1 - 0.9 x10E3/uL   EOS (ABSOLUTE)  0.7 (H) 0.0 - 0.4 x10E3/uL   Basophils Absolute 0.0 0.0 - 0.2 x10E3/uL   Immature Granulocytes 1 Not Estab. %   Immature Grans (Abs) 0.1 0.0 - 0.1 x10E3/uL  Comprehensive metabolic panel  Result Value Ref Range   Glucose 86 70 - 99 mg/dL   BUN 9 6 - 20 mg/dL   Creatinine, Ser 8.29 0.57 - 1.00 mg/dL   eGFR 562 >13 YQ/MVH/8.46   BUN/Creatinine Ratio 15 9 - 23   Sodium 139 134 - 144 mmol/L   Potassium 4.2 3.5 - 5.2 mmol/L   Chloride 103 96 - 106 mmol/L   CO2 21 20 - 29 mmol/L   Calcium 9.2 8.7 - 10.2 mg/dL   Total Protein 7.3 6.0 - 8.5 g/dL   Albumin 4.0 3.9 - 5.0 g/dL   Globulin, Total 3.3 1.5 - 4.5 g/dL   Albumin/Globulin Ratio 1.2 1.2 - 2.2   Bilirubin Total <0.2 0.0 - 1.2 mg/dL   Alkaline Phosphatase 111 44 - 121 IU/L   AST 16 0 - 40 IU/L   ALT 18 0 - 32 IU/L  Lipid panel  Result Value Ref Range   Cholesterol, Total 147 100 - 199 mg/dL   Triglycerides 68 0 - 149 mg/dL   HDL 46 >96 mg/dL   VLDL Cholesterol Cal 14 5 - 40 mg/dL   LDL Chol Calc (NIH) 87 0 - 99 mg/dL   Chol/HDL Ratio 3.2 0.0 - 4.4 ratio  TSH  Result Value Ref Range   TSH 0.955 0.450 - 4.500 uIU/mL  Urinalysis, Routine w reflex microscopic   Result Value Ref Range   Specific Gravity, UA >1.030 (H) 1.005 - 1.030   pH, UA 5.5 5.0 - 7.5   Color, UA Yellow Yellow   Appearance Ur Cloudy (A) Clear   Leukocytes,UA Trace (A) Negative   Protein,UA Negative Negative/Trace   Glucose, UA Negative Negative   Ketones, UA Negative Negative   RBC, UA Trace (A) Negative   Bilirubin, UA Negative Negative   Urobilinogen, Ur 0.2 0.2 - 1.0 mg/dL   Nitrite, UA Negative Negative   Microscopic Examination See below:       Assessment & Plan:   Problem List Items Addressed This Visit       Other   Bipolar 2 disorder (HCC)    Chronic. Controlled.  Doing well with Doxepin, Lamictal, and Zoloft.  Labs ordered.  Refills sent today.  Follow up in 6 months.  Call sooner if concerns arise.       Relevant Medications   lamoTRIgine (LAMICTAL) 25 MG tablet   sertraline (ZOLOFT) 50 MG tablet   Generalized abdominal pain   Relevant Medications   lamoTRIgine (LAMICTAL) 25 MG tablet   sertraline (ZOLOFT) 50 MG tablet   Other Visit Diagnoses     Annual physical exam    -  Primary   Health maintenance reviewed during visit today.  labs ordered.  Vaccines up to date.  PAP done.   Relevant Orders   CBC with Differential/Platelet   Comprehensive metabolic panel   Lipid panel   TSH   Urinalysis, Routine w reflex microscopic   Cytology - PAP   HIV Antibody (routine testing w rflx)   Screening for ischemic heart disease       Relevant Orders   Lipid panel   Screening for HIV (human immunodeficiency virus)       Relevant Orders   HIV Antibody (routine testing w rflx)   Nausea and vomiting, unspecified vomiting  type       Relevant Medications   lamoTRIgine (LAMICTAL) 25 MG tablet   sertraline (ZOLOFT) 50 MG tablet        Follow up plan: Return in about 6 months (around 09/30/2023) for HTN, HLD, DM2 FU.   LABORATORY TESTING:  - Pap smear: Done today  IMMUNIZATIONS:   - Tdap: Tetanus vaccination status reviewed: last tetanus booster  within 10 years. - Influenza: Up to date - Pneumovax: Not applicable - Prevnar: Not applicable - COVID: Not applicable - HPV: Administered today - Shingrix vaccine: Not applicable  SCREENING: -Mammogram: Not applicable  - Colonoscopy: Not applicable  - Bone Density: Not applicable  -Hearing Test: Not applicable  -Spirometry: Not applicable    PATIENT COUNSELING:   Advised to take 1 mg of folate supplement per day if capable of pregnancy.   Sexuality: Discussed sexually transmitted diseases, partner selection, use of condoms, avoidance of unintended pregnancy  and contraceptive alternatives.   Advised to avoid cigarette smoking.  I discussed with the patient that most people either abstain from alcohol or drink within safe limits (<=14/week and <=4 drinks/occasion for males, <=7/weeks and <= 3 drinks/occasion for females) and that the risk for alcohol disorders and other health effects rises proportionally with the number of drinks per week and how often a drinker exceeds daily limits.  Discussed cessation/primary prevention of drug use and availability of treatment for abuse.   Diet: Encouraged to adjust caloric intake to maintain  or achieve ideal body weight, to reduce intake of dietary saturated fat and total fat, to limit sodium intake by avoiding high sodium foods and not adding table salt, and to maintain adequate dietary potassium and calcium preferably from fresh fruits, vegetables, and low-fat dairy products.    stressed the importance of regular exercise  Injury prevention: Discussed safety belts, safety helmets, smoke detector, smoking near bedding or upholstery.   Dental health: Discussed importance of regular tooth brushing, flossing, and dental visits.    NEXT PREVENTATIVE PHYSICAL DUE IN 1 YEAR. Return in about 6 months (around 09/30/2023) for HTN, HLD, DM2 FU.

## 2023-03-30 NOTE — Assessment & Plan Note (Signed)
Chronic. Controlled.  Doing well with Doxepin, Lamictal, and Zoloft.  Labs ordered.  Refills sent today.  Follow up in 6 months.  Call sooner if concerns arise.

## 2023-03-31 LAB — CBC WITH DIFFERENTIAL/PLATELET
Basophils Absolute: 0.1 10*3/uL (ref 0.0–0.2)
Basos: 1 %
EOS (ABSOLUTE): 0.7 10*3/uL — ABNORMAL HIGH (ref 0.0–0.4)
Eos: 7 %
Hematocrit: 35.9 % (ref 34.0–46.6)
Hemoglobin: 12.2 g/dL (ref 11.1–15.9)
Immature Grans (Abs): 0 10*3/uL (ref 0.0–0.1)
Immature Granulocytes: 0 %
Lymphocytes Absolute: 3.1 10*3/uL (ref 0.7–3.1)
Lymphs: 30 %
MCH: 28.8 pg (ref 26.6–33.0)
MCHC: 34 g/dL (ref 31.5–35.7)
MCV: 85 fL (ref 79–97)
Monocytes Absolute: 0.7 10*3/uL (ref 0.1–0.9)
Monocytes: 6 %
Neutrophils Absolute: 5.9 10*3/uL (ref 1.4–7.0)
Neutrophils: 56 %
Platelets: 365 10*3/uL (ref 150–450)
RBC: 4.23 x10E6/uL (ref 3.77–5.28)
RDW: 13 % (ref 11.7–15.4)
WBC: 10.5 10*3/uL (ref 3.4–10.8)

## 2023-03-31 LAB — COMPREHENSIVE METABOLIC PANEL
ALT: 11 IU/L (ref 0–32)
AST: 17 IU/L (ref 0–40)
Albumin/Globulin Ratio: 1.4 (ref 1.2–2.2)
Albumin: 4.2 g/dL (ref 4.0–5.0)
Alkaline Phosphatase: 102 IU/L (ref 44–121)
BUN/Creatinine Ratio: 18 (ref 9–23)
BUN: 12 mg/dL (ref 6–20)
Bilirubin Total: <0.2 mg/dL (ref 0.0–1.2)
CO2: 23 mmol/L (ref 20–29)
Calcium: 9.6 mg/dL (ref 8.7–10.2)
Chloride: 103 mmol/L (ref 96–106)
Creatinine, Ser: 0.65 mg/dL (ref 0.57–1.00)
Globulin, Total: 3 g/dL (ref 1.5–4.5)
Glucose: 87 mg/dL (ref 70–99)
Potassium: 4.3 mmol/L (ref 3.5–5.2)
Sodium: 139 mmol/L (ref 134–144)
Total Protein: 7.2 g/dL (ref 6.0–8.5)
eGFR: 127 mL/min/{1.73_m2} (ref >59)

## 2023-03-31 LAB — TSH: TSH: 1.45 u[IU]/mL (ref 0.450–4.500)

## 2023-03-31 LAB — LIPID PANEL
Chol/HDL Ratio: 3.3 ratio (ref 0.0–4.4)
Cholesterol, Total: 140 mg/dL (ref 100–199)
HDL: 42 mg/dL (ref >39)
LDL Chol Calc (NIH): 81 mg/dL (ref 0–99)
Triglycerides: 92 mg/dL (ref 0–149)
VLDL Cholesterol Cal: 17 mg/dL (ref 5–40)

## 2023-03-31 LAB — HIV ANTIBODY (ROUTINE TESTING W REFLEX): HIV Screen 4th Generation wRfx: NONREACTIVE

## 2023-03-31 NOTE — Progress Notes (Signed)
Hi Kelly Pace. It was nice to see you yesterday.  Your lab work looks good.  No concerns at this time. Continue with your current medication regimen.  Follow up as discussed.  Please let me know if you have any questions.

## 2023-04-02 LAB — CYTOLOGY - PAP

## 2023-04-02 MED ORDER — METRONIDAZOLE 500 MG PO TABS: 500.0000 mg | ORAL_TABLET | Freq: Two times a day (BID) | ORAL | 0 refills | Status: AC

## 2023-04-02 NOTE — Addendum Note (Signed)
Addended by: Larae Grooms on: 04/02/2023 08:38 AM   Modules accepted: Orders

## 2023-04-02 NOTE — Progress Notes (Signed)
Please let patient know that her PAP came back abnormal.  The recommendation is to repeat the PAP in 1 year.  She also had some bacterial vaginosis which is an over growth of bacteria in the vaginal area.  I have sent in an antibiotic to treat this.  It is called flagyl and she shouldn't drink alcohol while taking the medication.

## 2023-05-04 ENCOUNTER — Ambulatory Visit: Payer: BC Managed Care – PPO | Admitting: Nurse Practitioner

## 2023-05-04 ENCOUNTER — Encounter: Payer: Self-pay | Admitting: Nurse Practitioner

## 2023-05-04 VITALS — BP 113/76 | HR 78 | Temp 98.7°F | Wt 166.2 lb

## 2023-05-04 DIAGNOSIS — K5900 Constipation, unspecified: Secondary | ICD-10-CM

## 2023-05-04 NOTE — Progress Notes (Unsigned)
BP 113/76   Pulse 78   Temp 98.7 F (37.1 C) (Oral)   Wt 166 lb 3.2 oz (75.4 kg)   SpO2 98%   BMI 30.58 kg/m    Subjective:    Patient ID: Kelly Pace, female    DOB: 08-07-2001, 22 y.o.   MRN: [id number]  HPI: Kelly Pace is a 22 y.o. female  Chief Complaint  Patient presents with   Constipation    Pt states she has not been able to have a bowel movement since Thursday. States she has tried multiple OTC meds like Dulcolax, Miralax, Magnesium Citrate, and Laxatives. States she is in a lot of pain in her abdomen because she has not been able to have a bowel movement.    ABDOMINAL ISSUES Duration: days Ashby Dawes: sharp Location: RLQ  Severity: severe  Radiation: no Episode duration: Frequency: constant Alleviating factors: bowel movements Aggravating factors: Treatments attempted:  suppository, miralax and laxatives- magnesium citrate, colace  Constipation: yes Diarrhea: no Episodes of diarrhea/day: Mucous in the stool: no Heartburn: no Bloating:no Flatulence: yes Nausea: yes Vomiting: yes Episodes of vomit/day: Melena or hematochezia: no Rash: no Jaundice: no Fever: no Weight loss: no 3-4 bottles of water daily.  Relevant past medical, surgical, family and social history reviewed and updated as indicated. Interim medical history since our last visit reviewed. Allergies and medications reviewed and updated.  Review of Systems  Constitutional:  Negative for fever.  Gastrointestinal:  Positive for abdominal pain, constipation, nausea and vomiting. Negative for abdominal distention and diarrhea.    Per HPI unless specifically indicated above     Objective:    BP 113/76   Pulse 78   Temp 98.7 F (37.1 C) (Oral)   Wt 166 lb 3.2 oz (75.4 kg)   SpO2 98%   BMI 30.58 kg/m   Wt Readings from Last 3 Encounters:  05/04/23 166 lb 3.2 oz (75.4 kg)  03/30/23 175 lb 6.4 oz (79.6 kg)  03/26/22 176 lb 3.2 oz (79.9 kg)    Physical Exam Vitals and nursing note  reviewed.  Constitutional:      General: She is not in acute distress.    Appearance: Normal appearance. She is normal weight. She is not ill-appearing, toxic-appearing or diaphoretic.  HENT:     Head: Normocephalic.     Right Ear: External ear normal.     Left Ear: External ear normal.     Nose: Nose normal.     Mouth/Throat:     Mouth: Mucous membranes are moist.     Pharynx: Oropharynx is clear.  Eyes:     General:        Right eye: No discharge.        Left eye: No discharge.     Extraocular Movements: Extraocular movements intact.     Conjunctiva/sclera: Conjunctivae normal.     Pupils: Pupils are equal, round, and reactive to light.  Cardiovascular:     Rate and Rhythm: Normal rate and regular rhythm.     Heart sounds: No murmur heard. Pulmonary:     Effort: Pulmonary effort is normal. No respiratory distress.     Breath sounds: Normal breath sounds. No wheezing or rales.  Abdominal:     General: Abdomen is flat. Bowel sounds are normal. There is no distension.     Palpations: Abdomen is soft.     Tenderness: There is abdominal tenderness in the right lower quadrant. There is no right CVA tenderness, left CVA tenderness, guarding  or rebound. Negative signs include Murphy's sign, Rovsing's sign, McBurney's sign, psoas sign and obturator sign.  Musculoskeletal:     Cervical back: Normal range of motion and neck supple.  Skin:    General: Skin is warm and dry.     Capillary Refill: Capillary refill takes less than 2 seconds.  Neurological:     General: No focal deficit present.     Mental Status: She is alert and oriented to person, place, and time. Mental status is at baseline.  Psychiatric:        Mood and Affect: Mood normal.        Behavior: Behavior normal.        Thought Content: Thought content normal.        Judgment: Judgment normal.     Results for orders placed or performed in visit on 03/30/23  CBC with Differential/Platelet  Result Value Ref Range   WBC  10.5 3.4 - 10.8 x10E3/uL   RBC 4.23 3.77 - 5.28 x10E6/uL   Hemoglobin 12.2 11.1 - 15.9 g/dL   Hematocrit 40.9 81.1 - 46.6 %   MCV 85 79 - 97 fL   MCH 28.8 26.6 - 33.0 pg   MCHC 34.0 31.5 - 35.7 g/dL   RDW 91.4 78.2 - 95.6 %   Platelets 365 150 - 450 x10E3/uL   Neutrophils 56 Not Estab. %   Lymphs 30 Not Estab. %   Monocytes 6 Not Estab. %   Eos 7 Not Estab. %   Basos 1 Not Estab. %   Neutrophils Absolute 5.9 1.4 - 7.0 x10E3/uL   Lymphocytes Absolute 3.1 0.7 - 3.1 x10E3/uL   Monocytes Absolute 0.7 0.1 - 0.9 x10E3/uL   EOS (ABSOLUTE) 0.7 (H) 0.0 - 0.4 x10E3/uL   Basophils Absolute 0.1 0.0 - 0.2 x10E3/uL   Immature Granulocytes 0 Not Estab. %   Immature Grans (Abs) 0.0 0.0 - 0.1 x10E3/uL  Comprehensive metabolic panel  Result Value Ref Range   Glucose 87 70 - 99 mg/dL   BUN 12 6 - 20 mg/dL   Creatinine, Ser 2.13 0.57 - 1.00 mg/dL   eGFR 086 >57 QI/ONG/2.95   BUN/Creatinine Ratio 18 9 - 23   Sodium 139 134 - 144 mmol/L   Potassium 4.3 3.5 - 5.2 mmol/L   Chloride 103 96 - 106 mmol/L   CO2 23 20 - 29 mmol/L   Calcium 9.6 8.7 - 10.2 mg/dL   Total Protein 7.2 6.0 - 8.5 g/dL   Albumin 4.2 4.0 - 5.0 g/dL   Globulin, Total 3.0 1.5 - 4.5 g/dL   Albumin/Globulin Ratio 1.4 1.2 - 2.2   Bilirubin Total <0.2 0.0 - 1.2 mg/dL   Alkaline Phosphatase 102 44 - 121 IU/L   AST 17 0 - 40 IU/L   ALT 11 0 - 32 IU/L  Lipid panel  Result Value Ref Range   Cholesterol, Total 140 100 - 199 mg/dL   Triglycerides 92 0 - 149 mg/dL   HDL 42 >28 mg/dL   VLDL Cholesterol Cal 17 5 - 40 mg/dL   LDL Chol Calc (NIH) 81 0 - 99 mg/dL   Chol/HDL Ratio 3.3 0.0 - 4.4 ratio  TSH  Result Value Ref Range   TSH 1.450 0.450 - 4.500 uIU/mL  Urinalysis, Routine w reflex microscopic  Result Value Ref Range   Specific Gravity, UA 1.025 1.005 - 1.030   pH, UA 6.5 5.0 - 7.5   Color, UA Yellow Yellow   Appearance Ur Clear Clear  Leukocytes,UA Negative Negative   Protein,UA Negative Negative/Trace   Glucose, UA  Negative Negative   Ketones, UA Trace (A) Negative   RBC, UA Negative Negative   Bilirubin, UA Negative Negative   Urobilinogen, Ur 1.0 0.2 - 1.0 mg/dL   Nitrite, UA Negative Negative   Microscopic Examination Comment   HIV Antibody (routine testing w rflx)  Result Value Ref Range   HIV Screen 4th Generation wRfx Non Reactive Non Reactive  Cytology - PAP  Result Value Ref Range   Adequacy      Satisfactory for evaluation; transformation zone component PRESENT.   Diagnosis - Low grade squamous intraepithelial lesion (LSIL) (A)    Microorganisms Shift in flora suggestive of bacterial vaginosis       Assessment & Plan:   Problem List Items Addressed This Visit   None Visit Diagnoses     Constipation, unspecified constipation type    -  Primary   Recommend miralax TID for 3 days. Increase water intake to 100 oz daily.  XRAY ordered for evaluation. FU if symptoms are not improved. Discussed ER precautions   Relevant Orders   DG Abd 2 Views        Follow up plan: Return if symptoms worsen or fail to improve.

## 2023-05-05 ENCOUNTER — Ambulatory Visit
Admission: RE | Admit: 2023-05-05 | Discharge: 2023-05-05 | Disposition: A | Discharge status: Home / Self Care | Payer: BC Managed Care – PPO | Source: Ambulatory Visit | Attending: Nurse Practitioner | Admitting: Nurse Practitioner

## 2023-05-05 ENCOUNTER — Ambulatory Visit
Admission: RE | Admit: 2023-05-05 | Discharge: 2023-05-05 | Disposition: A | Discharge status: Home / Self Care | Payer: BC Managed Care – PPO | Attending: Nurse Practitioner | Admitting: Nurse Practitioner

## 2023-05-05 DIAGNOSIS — K5900 Constipation, unspecified: Secondary | ICD-10-CM

## 2023-05-05 NOTE — Progress Notes (Signed)
HI Zoella. Your xray was reassuring.  No red flags at this time. Continue with the miralax as we discussed yesterday.  Keep me updated on how it goes.

## 2023-05-22 ENCOUNTER — Other Ambulatory Visit: Payer: Self-pay | Admitting: Nurse Practitioner

## 2023-05-22 NOTE — Telephone Encounter (Signed)
Requested Prescriptions  Pending Prescriptions Disp Refills   doxepin (SINEQUAN) 50 MG capsule [Pharmacy Med Name: DOXEPIN 50 MG CAPSULE] 90 capsule 1    Sig: TAKE 1 CAPSULE (50 MG TOTAL) BY MOUTH AT BEDTIME AS NEEDED.     Psychiatry:  Antidepressants - Heterocyclics (TCAs) Passed - 05/22/2023 10:30 AM      Passed - Valid encounter within last 6 months    Recent Outpatient Visits           2 weeks ago Constipation, unspecified constipation type   [place] [facility] Larae Grooms, NP   1 month ago Annual physical exam   [facility] Larae Grooms, NP   1 year ago Generalized abdominal pain   [place] [facility] Larae Grooms, NP   1 year ago Generalized abdominal pain   [place] [facility] Larae Grooms, NP   1 year ago Annual physical exam   [place] [facility] Larae Grooms, NP       Future Appointments             In 4 months Larae Grooms, NP [place] [facility]           Psychiatry:  Antidepressants - Heterocyclics (TCAs) - doxepin Passed - 05/22/2023 10:30 AM      Passed - Valid encounter within last 12 months    Recent Outpatient Visits           2 weeks ago Constipation, unspecified constipation type   [place] [facility] Larae Grooms, NP   1 month ago Annual physical exam   [place] [facility] Larae Grooms, NP   1 year ago Generalized abdominal pain   [place] [facility] Larae Grooms, NP   1 year ago Generalized abdominal pain   [place] [facility] Larae Grooms, NP   1 year ago Annual physical exam   [place] [facility] Larae Grooms, NP       Future Appointments             In 4 months Larae Grooms, NP [place] [facility]
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
## 2023-09-02 ENCOUNTER — Encounter: Payer: Self-pay | Admitting: Nurse Practitioner

## 2023-09-02 ENCOUNTER — Ambulatory Visit: Payer: BC Managed Care – PPO | Admitting: Nurse Practitioner

## 2023-09-02 VITALS — BP 120/86 | HR 98 | Temp 98.5°F | Wt 159.6 lb

## 2023-09-02 DIAGNOSIS — S134XXA Sprain of ligaments of cervical spine, initial encounter: Secondary | ICD-10-CM | POA: Diagnosis not present

## 2023-09-02 MED ORDER — CYCLOBENZAPRINE HCL 10 MG PO TABS: 10.0000 mg | ORAL_TABLET | Freq: Three times a day (TID) | ORAL | 0 refills | Status: DC | PRN

## 2023-09-02 MED ORDER — IBUPROFEN 800 MG PO TABS: 800.0000 mg | ORAL_TABLET | Freq: Three times a day (TID) | ORAL | 0 refills | Status: AC | PRN

## 2023-09-02 MED ORDER — METHYLPREDNISOLONE 4 MG PO TBPK: ORAL_TABLET | ORAL | 0 refills | Status: DC

## 2023-10-02 ENCOUNTER — Ambulatory Visit: Payer: BC Managed Care – PPO | Admitting: Nurse Practitioner

## 2023-10-21 ENCOUNTER — Ambulatory Visit: Payer: BC Managed Care – PPO | Admitting: Nurse Practitioner

## 2023-10-21 VITALS — BP 103/72 | HR 63 | Temp 98.2°F | Ht 61.81 in | Wt 164.2 lb

## 2023-10-21 DIAGNOSIS — R1084 Generalized abdominal pain: Secondary | ICD-10-CM | POA: Diagnosis not present

## 2023-10-21 DIAGNOSIS — Z23 Encounter for immunization: Secondary | ICD-10-CM | POA: Diagnosis not present

## 2023-10-21 DIAGNOSIS — R112 Nausea with vomiting, unspecified: Secondary | ICD-10-CM

## 2023-10-21 DIAGNOSIS — F3181 Bipolar II disorder: Secondary | ICD-10-CM

## 2023-10-21 MED ORDER — LAMOTRIGINE 25 MG PO TABS
ORAL_TABLET | ORAL | 1 refills | Status: DC
Start: 2023-10-21 — End: 2024-05-11

## 2023-10-21 MED ORDER — SERTRALINE HCL 50 MG PO TABS: 50.0000 mg | ORAL_TABLET | Freq: Every day | ORAL | 1 refills | Status: DC

## 2023-10-21 MED ORDER — DOXEPIN HCL 50 MG PO CAPS: 50.0000 mg | ORAL_CAPSULE | Freq: Every evening | ORAL | 1 refills | Status: DC | PRN

## 2023-10-21 NOTE — Progress Notes (Signed)
BP 103/72 (BP Location: Left Arm, Patient Position: Sitting, Cuff Size: Normal)   Pulse 63   Temp 98.2 F (36.8 C) (Oral)   Ht 5' 1.81" (1.57 m)   Wt 164 lb 3.2 oz (74.5 kg)   SpO2 98%   BMI 30.22 kg/m    Subjective:    Patient ID: Kelly Pace, female    DOB: 2001-03-27, 22 y.o.   MRN: [id number]  HPI: Kelly Pace is a 22 y.o. female  Chief Complaint  Patient presents with   6 month follow up   MOOD Patient states she feels like her mood has been good. She is taking the Doxepin, Lamictal 25mg  and Zoloft 50mg .  She feels like she is doing well.  She feels like she is managing her stress better.  Denies SI.  Is sleeping better.    Relevant past medical, surgical, family and social history reviewed and updated as indicated. Interim medical history since our last visit reviewed. Allergies and medications reviewed and updated.  Review of Systems  Psychiatric/Behavioral:  Positive for dysphoric mood and sleep disturbance. Negative for suicidal ideas. The patient is nervous/anxious.     Per HPI unless specifically indicated above     Objective:    BP 103/72 (BP Location: Left Arm, Patient Position: Sitting, Cuff Size: Normal)   Pulse 63   Temp 98.2 F (36.8 C) (Oral)   Ht 5' 1.81" (1.57 m)   Wt 164 lb 3.2 oz (74.5 kg)   SpO2 98%   BMI 30.22 kg/m   Wt Readings from Last 3 Encounters:  10/21/23 164 lb 3.2 oz (74.5 kg)  09/02/23 159 lb 9.6 oz (72.4 kg)  05/04/23 166 lb 3.2 oz (75.4 kg)    Physical Exam Vitals and nursing note reviewed.  Constitutional:      General: She is not in acute distress.    Appearance: Normal appearance. She is normal weight. She is not ill-appearing, toxic-appearing or diaphoretic.  HENT:     Head: Normocephalic.     Right Ear: External ear normal.     Left Ear: External ear normal.     Nose: Nose normal.     Mouth/Throat:     Mouth: Mucous membranes are moist.     Pharynx: Oropharynx is clear.  Eyes:     General:        Right  eye: No discharge.        Left eye: No discharge.     Extraocular Movements: Extraocular movements intact.     Conjunctiva/sclera: Conjunctivae normal.     Pupils: Pupils are equal, round, and reactive to light.  Cardiovascular:     Rate and Rhythm: Normal rate and regular rhythm.     Heart sounds: No murmur heard. Pulmonary:     Effort: Pulmonary effort is normal. No respiratory distress.     Breath sounds: Normal breath sounds. No wheezing or rales.  Musculoskeletal:     Cervical back: Normal range of motion and neck supple.  Skin:    General: Skin is warm and dry.     Capillary Refill: Capillary refill takes less than 2 seconds.  Neurological:     General: No focal deficit present.     Mental Status: She is alert and oriented to person, place, and time. Mental status is at baseline.  Psychiatric:        Mood and Affect: Mood normal.        Behavior: Behavior normal.  Thought Content: Thought content normal.        Judgment: Judgment normal.     Results for orders placed or performed in visit on 03/30/23  CBC with Differential/Platelet  Result Value Ref Range   WBC 10.5 3.4 - 10.8 x10E3/uL   RBC 4.23 3.77 - 5.28 x10E6/uL   Hemoglobin 12.2 11.1 - 15.9 g/dL   Hematocrit 40.9 81.1 - 46.6 %   MCV 85 79 - 97 fL   MCH 28.8 26.6 - 33.0 pg   MCHC 34.0 31.5 - 35.7 g/dL   RDW 91.4 78.2 - 95.6 %   Platelets 365 150 - 450 x10E3/uL   Neutrophils 56 Not Estab. %   Lymphs 30 Not Estab. %   Monocytes 6 Not Estab. %   Eos 7 Not Estab. %   Basos 1 Not Estab. %   Neutrophils Absolute 5.9 1.4 - 7.0 x10E3/uL   Lymphocytes Absolute 3.1 0.7 - 3.1 x10E3/uL   Monocytes Absolute 0.7 0.1 - 0.9 x10E3/uL   EOS (ABSOLUTE) 0.7 (H) 0.0 - 0.4 x10E3/uL   Basophils Absolute 0.1 0.0 - 0.2 x10E3/uL   Immature Granulocytes 0 Not Estab. %   Immature Grans (Abs) 0.0 0.0 - 0.1 x10E3/uL  Comprehensive metabolic panel  Result Value Ref Range   Glucose 87 70 - 99 mg/dL   BUN 12 6 - 20 mg/dL    Creatinine, Ser 2.13 0.57 - 1.00 mg/dL   eGFR 086 >57 QI/ONG/2.95   BUN/Creatinine Ratio 18 9 - 23   Sodium 139 134 - 144 mmol/L   Potassium 4.3 3.5 - 5.2 mmol/L   Chloride 103 96 - 106 mmol/L   CO2 23 20 - 29 mmol/L   Calcium 9.6 8.7 - 10.2 mg/dL   Total Protein 7.2 6.0 - 8.5 g/dL   Albumin 4.2 4.0 - 5.0 g/dL   Globulin, Total 3.0 1.5 - 4.5 g/dL   Albumin/Globulin Ratio 1.4 1.2 - 2.2   Bilirubin Total <0.2 0.0 - 1.2 mg/dL   Alkaline Phosphatase 102 44 - 121 IU/L   AST 17 0 - 40 IU/L   ALT 11 0 - 32 IU/L  Lipid panel  Result Value Ref Range   Cholesterol, Total 140 100 - 199 mg/dL   Triglycerides 92 0 - 149 mg/dL   HDL 42 >28 mg/dL   VLDL Cholesterol Cal 17 5 - 40 mg/dL   LDL Chol Calc (NIH) 81 0 - 99 mg/dL   Chol/HDL Ratio 3.3 0.0 - 4.4 ratio  TSH  Result Value Ref Range   TSH 1.450 0.450 - 4.500 uIU/mL  Urinalysis, Routine w reflex microscopic  Result Value Ref Range   Specific Gravity, UA 1.025 1.005 - 1.030   pH, UA 6.5 5.0 - 7.5   Color, UA Yellow Yellow   Appearance Ur Clear Clear   Leukocytes,UA Negative Negative   Protein,UA Negative Negative/Trace   Glucose, UA Negative Negative   Ketones, UA Trace (A) Negative   RBC, UA Negative Negative   Bilirubin, UA Negative Negative   Urobilinogen, Ur 1.0 0.2 - 1.0 mg/dL   Nitrite, UA Negative Negative   Microscopic Examination Comment   HIV Antibody (routine testing w rflx)  Result Value Ref Range   HIV Screen 4th Generation wRfx Non Reactive Non Reactive  Cytology - PAP  Result Value Ref Range   Adequacy      Satisfactory for evaluation; transformation zone component PRESENT.   Diagnosis - Low grade squamous intraepithelial lesion (LSIL) (A)    Microorganisms  Shift in flora suggestive of bacterial vaginosis       Assessment & Plan:   Problem List Items Addressed This Visit       Other   Bipolar 2 disorder (HCC) - Primary    Chronic. Controlled.  Doing well with Doxepin, Lamictal, and Zoloft.  Stress has  been better.  Labs ordered.  Refills sent today.  Follow up in 6 months.  Call sooner if concerns arise.       Relevant Medications   lamoTRIgine (LAMICTAL) 25 MG tablet   sertraline (ZOLOFT) 50 MG tablet   Generalized abdominal pain   Relevant Medications   lamoTRIgine (LAMICTAL) 25 MG tablet   sertraline (ZOLOFT) 50 MG tablet   Other Visit Diagnoses     Nausea and vomiting, unspecified vomiting type       Relevant Medications   lamoTRIgine (LAMICTAL) 25 MG tablet   sertraline (ZOLOFT) 50 MG tablet   Need for influenza vaccination       Relevant Orders   Flu vaccine trivalent PF, 6mos and older(Flulaval,Afluria,Fluarix,Fluzone)        Follow up plan: Return in about 6 months (around 04/20/2024) for Physical and Fasting labs.

## 2023-10-21 NOTE — Assessment & Plan Note (Addendum)
Chronic. Controlled.  Doing well with Doxepin, Lamictal, and Zoloft.  Stress has been better.  Labs ordered.  Refills sent today.  Follow up in 6 months.  Call sooner if concerns arise.

## 2024-04-20 ENCOUNTER — Encounter: Payer: Self-pay | Admitting: Nurse Practitioner

## 2024-05-11 ENCOUNTER — Encounter: Payer: Self-pay | Admitting: Nurse Practitioner

## 2024-05-11 ENCOUNTER — Ambulatory Visit: Admitting: Nurse Practitioner

## 2024-05-11 VITALS — BP 104/61 | HR 92 | Ht 61.0 in | Wt 162.0 lb

## 2024-05-11 DIAGNOSIS — Z136 Encounter for screening for cardiovascular disorders: Secondary | ICD-10-CM | POA: Diagnosis not present

## 2024-05-11 DIAGNOSIS — F3181 Bipolar II disorder: Secondary | ICD-10-CM

## 2024-05-11 DIAGNOSIS — R112 Nausea with vomiting, unspecified: Secondary | ICD-10-CM | POA: Diagnosis not present

## 2024-05-11 DIAGNOSIS — R1084 Generalized abdominal pain: Secondary | ICD-10-CM

## 2024-05-11 DIAGNOSIS — Z0000 Encounter for general adult medical examination without abnormal findings: Secondary | ICD-10-CM

## 2024-05-11 MED ORDER — SERTRALINE HCL 50 MG PO TABS: 50.0000 mg | ORAL_TABLET | Freq: Every day | ORAL | 1 refills | Status: DC

## 2024-05-11 MED ORDER — LAMOTRIGINE 25 MG PO TABS: ORAL_TABLET | ORAL | 1 refills | Status: DC

## 2024-05-11 MED ORDER — DOXEPIN HCL 50 MG PO CAPS: 50.0000 mg | ORAL_CAPSULE | Freq: Every evening | ORAL | 1 refills | Status: DC | PRN

## 2024-05-11 NOTE — Progress Notes (Unsigned)
 BP 104/61   Pulse 92   Ht 5' 1 (1.549 m)   Wt 162 lb (73.5 kg)   LMP 04/25/2024   BMI 30.61 kg/m    Subjective:    Patient ID: Kelly Pace, female    DOB: June 22, 2001, 23 y.o.   MRN: [id number]  HPI: Kelly Pace is a 23 y.o. female presenting on 05/11/2024 for comprehensive medical examination. Current medical complaints include:Abdominal pain  She currently lives with: Menopausal Symptoms: no  MOOD Patient states she feels like her mood has been good. She is taking the Doxepin , Lamictal  25mg  and Zoloft  50mg .  She feels like she is doing well.  Denies concerns at visit today.  Denies SI.    Depression Screen done today and results listed below:     05/11/2024    4:05 PM 10/21/2023    3:47 PM 09/02/2023    9:23 AM 05/04/2023    3:55 PM 03/30/2023    2:27 PM  Depression screen PHQ 2/9  Decreased Interest 1 1 0 1 2  Down, Depressed, Hopeless 1 1 0 2 1  PHQ - 2 Score 2 2 0 3 3  Altered sleeping 1 2 0 1 2  Tired, decreased energy 2 1 0 1 1  Change in appetite 2 2 0 2 3  Feeling bad or failure about yourself  1 1 0 1 0  Trouble concentrating 1 1 0 1 2  Moving slowly or fidgety/restless 1 1 0 0 2  Suicidal thoughts 0 1 0 0 0  PHQ-9 Score 10 11 0 9 13  Difficult doing work/chores Somewhat difficult  Not difficult at all Somewhat difficult Somewhat difficult    The patient does not have a history of falls. I did complete a risk assessment for falls. A plan of care for falls was documented.   Past Medical History:  Past Medical History:  Diagnosis Date   Anxiety    Asthma    Bipolar 2 disorder (HCC)    Eczema    Wears contact lenses     Surgical History:  Past Surgical History:  Procedure Laterality Date   NO PAST SURGERIES     TONSILLECTOMY N/A 08/31/2019   Procedure: TONSILLECTOMY;  Surgeon: Milissa Hamming, MD;  Location: [facility];  Service: ENT;  Laterality: N/A;    Medications:  Current Outpatient Medications on File Prior to Visit   Medication Sig   ibuprofen  (ADVIL ) 800 MG tablet Take 1 tablet (800 mg total) by mouth every 8 (eight) hours as needed.   No current facility-administered medications on file prior to visit.    Allergies:  Allergies  Allergen Reactions   Cashew Nut Oil Anaphylaxis   Fish Allergy Anaphylaxis    ALL seafood   Watermelon [Citrullus Vulgaris] Itching    tongue   Nickel Rash    Social History:  Social History   Socioeconomic History   Marital status: Single    Spouse name: Not on file   Number of children: Not on file   Years of education: Not on file   Highest education level: Some college, no degree  Occupational History   Not on file  Tobacco Use   Smoking status: Never   Smokeless tobacco: Never   Tobacco comments:    socially in HS  Vaping Use   Vaping status: Never Used  Substance and Sexual Activity   Alcohol use: Never   Drug use: Yes    Types: Marijuana    Comment:  on occasion   Sexual activity: Yes  Other Topics Concern   Not on file  Social History Narrative   Not on file   Social Drivers of Health   Financial Resource Strain: Low Risk  (10/21/2023)   Overall Financial Resource Strain (CARDIA)    Difficulty of Paying Living Expenses: Not very hard  Food Insecurity: No Food Insecurity (10/21/2023)   Hunger Vital Sign    Worried About Running Out of Food in the Last Year: Never true    Ran Out of Food in the Last Year: Never true  Transportation Needs: No Transportation Needs (10/21/2023)   PRAPARE - Administrator, Civil Service (Medical): No    Lack of Transportation (Non-Medical): No  Physical Activity: Sufficiently Active (10/21/2023)   Exercise Vital Sign    Days of Exercise per Week: 5 days    Minutes of Exercise per Session: 150+ min  Stress: Stress Concern Present (10/21/2023)   [employer] of Occupational Health - Occupational Stress Questionnaire    Feeling of Stress : Rather much  Social Connections: Socially Isolated  (10/21/2023)   Social Connection and Isolation Panel    Frequency of Communication with Friends and Family: Three times a week    Frequency of Social Gatherings with Friends and Family: Three times a week    Attends Religious Services: Never    Active Member of Clubs or Organizations: No    Attends Engineer, structural: Not on file    Marital Status: Never married  Catering manager Violence: Not on file   Social History   Tobacco Use  Smoking Status Never  Smokeless Tobacco Never  Tobacco Comments   socially in HS   Social History   Substance and Sexual Activity  Alcohol Use Never    Family History:  Family History  Problem Relation Age of Onset   Migraines Father    Bipolar disorder Maternal Uncle    Heart disease Paternal Aunt    Breast cancer Maternal Grandmother     Past medical history, surgical history, medications, allergies, family history and social history reviewed with patient today and changes made to appropriate areas of the chart.   Review of Systems  Psychiatric/Behavioral:  Positive for depression. Negative for suicidal ideas. The patient is nervous/anxious.    All other ROS negative except what is listed above and in the HPI.      Objective:    BP 104/61   Pulse 92   Ht 5' 1 (1.549 m)   Wt 162 lb (73.5 kg)   LMP 04/25/2024   BMI 30.61 kg/m   Wt Readings from Last 3 Encounters:  05/11/24 162 lb (73.5 kg)  10/21/23 164 lb 3.2 oz (74.5 kg)  09/02/23 159 lb 9.6 oz (72.4 kg)    Physical Exam Vitals and nursing note reviewed.  Constitutional:      General: She is awake. She is not in acute distress.    Appearance: Normal appearance. She is well-developed. She is obese. She is not ill-appearing.  HENT:     Head: Normocephalic and atraumatic.     Right Ear: Hearing, tympanic membrane, ear canal and external ear normal. No drainage.     Left Ear: Hearing, tympanic membrane, ear canal and external ear normal. No drainage.     Nose: Nose  normal.     Right Sinus: No maxillary sinus tenderness or frontal sinus tenderness.     Left Sinus: No maxillary sinus tenderness or frontal sinus tenderness.  Mouth/Throat:     Mouth: Mucous membranes are moist.     Pharynx: Oropharynx is clear. Uvula midline. No pharyngeal swelling, oropharyngeal exudate or posterior oropharyngeal erythema.   Eyes:     General: Lids are normal.        Right eye: No discharge.        Left eye: No discharge.     Extraocular Movements: Extraocular movements intact.     Conjunctiva/sclera: Conjunctivae normal.     Pupils: Pupils are equal, round, and reactive to light.     Visual Fields: Right eye visual fields normal and left eye visual fields normal.   Neck:     Thyroid : No thyromegaly.     Vascular: No carotid bruit.     Trachea: Trachea normal.   Cardiovascular:     Rate and Rhythm: Normal rate and regular rhythm.     Heart sounds: Normal heart sounds. No murmur heard.    No gallop.  Pulmonary:     Effort: Pulmonary effort is normal. No accessory muscle usage or respiratory distress.     Breath sounds: Normal breath sounds.  Chest:  Breasts:    Right: Normal.     Left: Normal.  Abdominal:     General: Bowel sounds are normal.     Palpations: Abdomen is soft. There is no hepatomegaly or splenomegaly.     Tenderness: There is no abdominal tenderness.   Musculoskeletal:        General: Normal range of motion.     Cervical back: Normal range of motion and neck supple.     Right lower leg: No edema.     Left lower leg: No edema.  Lymphadenopathy:     Head:     Right side of head: No submental, submandibular, tonsillar, preauricular or posterior auricular adenopathy.     Left side of head: No submental, submandibular, tonsillar, preauricular or posterior auricular adenopathy.     Cervical: No cervical adenopathy.     Upper Body:     Right upper body: No supraclavicular, axillary or pectoral adenopathy.     Left upper body: No  supraclavicular, axillary or pectoral adenopathy.   Skin:    General: Skin is warm and dry.     Capillary Refill: Capillary refill takes less than 2 seconds.     Findings: No rash.   Neurological:     Mental Status: She is alert and oriented to person, place, and time.     Gait: Gait is intact.   Psychiatric:        Attention and Perception: Attention normal.        Mood and Affect: Mood normal.        Speech: Speech normal.        Behavior: Behavior normal. Behavior is cooperative.        Thought Content: Thought content normal.        Judgment: Judgment normal.     Results for orders placed or performed in visit on 05/11/24  CBC with Differential/Platelet   Collection Time: 05/11/24  4:06 PM  Result Value Ref Range   WBC 11.4 (H) 3.4 - 10.8 x10E3/uL   RBC 4.42 3.77 - 5.28 x10E6/uL   Hemoglobin 13.6 11.1 - 15.9 g/dL   Hematocrit 59.4 65.9 - 46.6 %   MCV 92 79 - 97 fL   MCH 30.8 26.6 - 33.0 pg   MCHC 33.6 31.5 - 35.7 g/dL   RDW 87.8 88.2 - 84.5 %   Platelets  337 150 - 450 x10E3/uL   Neutrophils 64 Not Estab. %   Lymphs 25 Not Estab. %   Monocytes 7 Not Estab. %   Eos 4 Not Estab. %   Basos 0 Not Estab. %   Neutrophils Absolute 7.3 (H) 1.4 - 7.0 x10E3/uL   Lymphocytes Absolute 2.8 0.7 - 3.1 x10E3/uL   Monocytes Absolute 0.8 0.1 - 0.9 x10E3/uL   EOS (ABSOLUTE) 0.4 0.0 - 0.4 x10E3/uL   Basophils Absolute 0.0 0.0 - 0.2 x10E3/uL   Immature Granulocytes 0 Not Estab. %   Immature Grans (Abs) 0.0 0.0 - 0.1 x10E3/uL  Comprehensive metabolic panel with GFR   Collection Time: 05/11/24  4:06 PM  Result Value Ref Range   Glucose 93 70 - 99 mg/dL   BUN 13 6 - 20 mg/dL   Creatinine, Ser 9.30 0.57 - 1.00 mg/dL   eGFR 876 >40 fO/fpw/8.26   BUN/Creatinine Ratio 19 9 - 23   Sodium 136 134 - 144 mmol/L   Potassium 4.3 3.5 - 5.2 mmol/L   Chloride 100 96 - 106 mmol/L   CO2 20 20 - 29 mmol/L   Calcium 9.3 8.7 - 10.2 mg/dL   Total Protein 7.2 6.0 - 8.5 g/dL   Albumin 4.4 4.0 - 5.0  g/dL   Globulin, Total 2.8 1.5 - 4.5 g/dL   Bilirubin Total 0.2 0.0 - 1.2 mg/dL   Alkaline Phosphatase 112 44 - 121 IU/L   AST 39 0 - 40 IU/L   ALT 83 (H) 0 - 32 IU/L  Lipid panel   Collection Time: 05/11/24  4:06 PM  Result Value Ref Range   Cholesterol, Total 142 100 - 199 mg/dL   Triglycerides 871 0 - 149 mg/dL   HDL 46 >60 mg/dL   VLDL Cholesterol Cal 23 5 - 40 mg/dL   LDL Chol Calc (NIH) 73 0 - 99 mg/dL   Chol/HDL Ratio 3.1 0.0 - 4.4 ratio  TSH   Collection Time: 05/11/24  4:06 PM  Result Value Ref Range   TSH 0.451 0.450 - 4.500 uIU/mL      Assessment & Plan:   Problem List Items Addressed This Visit       Other   Bipolar 2 disorder (HCC)   Chronic.  Controlled.  Continue with current medication regimen of Lamictal  and Doxepin .  Refills sent today.  Labs ordered today.  Return to clinic in 6 months for reevaluation.  Call sooner if concerns arise.        Relevant Medications   lamoTRIgine  (LAMICTAL ) 25 MG tablet   sertraline  (ZOLOFT ) 50 MG tablet   Generalized abdominal pain   Relevant Medications   lamoTRIgine  (LAMICTAL ) 25 MG tablet   sertraline  (ZOLOFT ) 50 MG tablet   Other Visit Diagnoses       Annual physical exam    -  Primary   Health maintenance reviewed during visit today.  Labs ordered.  Vaccines reviewed.  PAP will be done at next visit.   Relevant Orders   CBC with Differential/Platelet (Completed)   Comprehensive metabolic panel with GFR (Completed)   Lipid panel (Completed)   TSH (Completed)     Screening for ischemic heart disease       Relevant Orders   Lipid panel (Completed)     Nausea and vomiting, unspecified vomiting type       Relevant Medications   lamoTRIgine  (LAMICTAL ) 25 MG tablet   sertraline  (ZOLOFT ) 50 MG tablet  Follow up plan: Return in about 1 month (around 06/10/2024) for PAP.   LABORATORY TESTING:  - Pap smear: Done today  IMMUNIZATIONS:   - Tdap: Tetanus vaccination status reviewed: last tetanus  booster within 10 years. - Influenza: Up to date - Pneumovax: Not applicable - Prevnar: Not applicable - COVID: Not applicable - HPV: Administered today - Shingrix vaccine: Not applicable  SCREENING: -Mammogram: Not applicable  - Colonoscopy: Not applicable  - Bone Density: Not applicable  -Hearing Test: Not applicable  -Spirometry: Not applicable    PATIENT COUNSELING:   Advised to take 1 mg of folate supplement per day if capable of pregnancy.   Sexuality: Discussed sexually transmitted diseases, partner selection, use of condoms, avoidance of unintended pregnancy  and contraceptive alternatives.   Advised to avoid cigarette smoking.  I discussed with the patient that most people either abstain from alcohol or drink within safe limits (<=14/week and <=4 drinks/occasion for males, <=7/weeks and <= 3 drinks/occasion for females) and that the risk for alcohol disorders and other health effects rises proportionally with the number of drinks per week and how often a drinker exceeds daily limits.  Discussed cessation/primary prevention of drug use and availability of treatment for abuse.   Diet: Encouraged to adjust caloric intake to maintain  or achieve ideal body weight, to reduce intake of dietary saturated fat and total fat, to limit sodium intake by avoiding high sodium foods and not adding table salt, and to maintain adequate dietary potassium and calcium preferably from fresh fruits, vegetables, and low-fat dairy products.    stressed the importance of regular exercise  Injury prevention: Discussed safety belts, safety helmets, smoke detector, smoking near bedding or upholstery.   Dental health: Discussed importance of regular tooth brushing, flossing, and dental visits.    NEXT PREVENTATIVE PHYSICAL DUE IN 1 YEAR. Return in about 1 month (around 06/10/2024) for PAP.

## 2024-05-12 ENCOUNTER — Ambulatory Visit: Payer: Self-pay | Admitting: Nurse Practitioner

## 2024-05-12 LAB — CBC WITH DIFFERENTIAL/PLATELET
Basophils Absolute: 0 10*3/uL (ref 0.0–0.2)
Basos: 0 %
EOS (ABSOLUTE): 0.4 10*3/uL (ref 0.0–0.4)
Eos: 4 %
Hematocrit: 40.5 % (ref 34.0–46.6)
Hemoglobin: 13.6 g/dL (ref 11.1–15.9)
Immature Grans (Abs): 0 10*3/uL (ref 0.0–0.1)
Immature Granulocytes: 0 %
Lymphocytes Absolute: 2.8 10*3/uL (ref 0.7–3.1)
Lymphs: 25 %
MCH: 30.8 pg (ref 26.6–33.0)
MCHC: 33.6 g/dL (ref 31.5–35.7)
MCV: 92 fL (ref 79–97)
Monocytes Absolute: 0.8 10*3/uL (ref 0.1–0.9)
Monocytes: 7 %
Neutrophils Absolute: 7.3 10*3/uL — ABNORMAL HIGH (ref 1.4–7.0)
Neutrophils: 64 %
Platelets: 337 10*3/uL (ref 150–450)
RBC: 4.42 x10E6/uL (ref 3.77–5.28)
RDW: 12.1 % (ref 11.7–15.4)
WBC: 11.4 10*3/uL — ABNORMAL HIGH (ref 3.4–10.8)

## 2024-05-12 LAB — COMPREHENSIVE METABOLIC PANEL WITH GFR
ALT: 83 IU/L — ABNORMAL HIGH (ref 0–32)
AST: 39 IU/L (ref 0–40)
Albumin: 4.4 g/dL (ref 4.0–5.0)
Alkaline Phosphatase: 112 IU/L (ref 44–121)
BUN/Creatinine Ratio: 19 (ref 9–23)
BUN: 13 mg/dL (ref 6–20)
Bilirubin Total: 0.2 mg/dL (ref 0.0–1.2)
CO2: 20 mmol/L (ref 20–29)
Calcium: 9.3 mg/dL (ref 8.7–10.2)
Chloride: 100 mmol/L (ref 96–106)
Creatinine, Ser: 0.69 mg/dL (ref 0.57–1.00)
Globulin, Total: 2.8 g/dL (ref 1.5–4.5)
Glucose: 93 mg/dL (ref 70–99)
Potassium: 4.3 mmol/L (ref 3.5–5.2)
Sodium: 136 mmol/L (ref 134–144)
Total Protein: 7.2 g/dL (ref 6.0–8.5)
eGFR: 123 mL/min/{1.73_m2} (ref >59)

## 2024-05-12 LAB — LIPID PANEL
Chol/HDL Ratio: 3.1 ratio (ref 0.0–4.4)
Cholesterol, Total: 142 mg/dL (ref 100–199)
HDL: 46 mg/dL (ref >39)
LDL Chol Calc (NIH): 73 mg/dL (ref 0–99)
Triglycerides: 128 mg/dL (ref 0–149)
VLDL Cholesterol Cal: 23 mg/dL (ref 5–40)

## 2024-05-12 LAB — TSH: TSH: 0.451 u[IU]/mL (ref 0.450–4.500)

## 2024-05-12 NOTE — Assessment & Plan Note (Addendum)
 Chronic.  Controlled.  Continue with current medication regimen of Lamictal  and Doxepin .  Refills sent today.  Labs ordered today.  Return to clinic in 6 months for reevaluation.  Call sooner if concerns arise.

## 2024-06-13 ENCOUNTER — Encounter: Payer: Self-pay | Admitting: Nurse Practitioner

## 2024-06-13 ENCOUNTER — Ambulatory Visit (INDEPENDENT_AMBULATORY_CARE_PROVIDER_SITE_OTHER): Admitting: Nurse Practitioner

## 2024-06-13 ENCOUNTER — Other Ambulatory Visit (HOSPITAL_COMMUNITY)
Admission: RE | Admit: 2024-06-13 | Discharge: 2024-06-13 | Disposition: A | Discharge status: Home / Self Care | Source: Ambulatory Visit | Attending: Nurse Practitioner | Admitting: Nurse Practitioner

## 2024-06-13 VITALS — BP 113/80 | HR 89 | Temp 98.5°F | Ht 61.0 in | Wt 163.4 lb

## 2024-06-13 DIAGNOSIS — L0292 Furuncle, unspecified: Secondary | ICD-10-CM | POA: Diagnosis not present

## 2024-06-13 DIAGNOSIS — Z124 Encounter for screening for malignant neoplasm of cervix: Secondary | ICD-10-CM | POA: Diagnosis not present

## 2024-06-13 MED ORDER — EPINEPHRINE 0.3 MG/0.3ML IJ SOAJ: 0.3000 mg | INTRAMUSCULAR | 1 refills | Status: AC | PRN

## 2024-06-13 NOTE — Progress Notes (Signed)
 BP 113/80   Pulse 89   Temp 98.5 F (36.9 C) (Oral)   Ht 5' 1 (1.549 m)   Wt 163 lb 6.4 oz (74.1 kg)   LMP 05/11/2024 (Within Days)   SpO2 98%   BMI 30.87 kg/m    Subjective:    Patient ID: Kelly Pace, female    DOB: 2001-08-27, 23 y.o.   MRN: [id number]  HPI: Kelly Pace is a 23 y.o. female  Chief Complaint  Patient presents with   Gynecologic Exam   Medication Refill    Needs epi pen   Patient presents to clinic to have her PAP done.  She states she does have recurrent boils.   Needs a new epi pen.    Relevant past medical, surgical, family and social history reviewed and updated as indicated. Interim medical history since our last visit reviewed. Allergies and medications reviewed and updated.  Review of Systems  Skin:        Recurrent boils  All other systems reviewed and are negative.   Per HPI unless specifically indicated above     Objective:    BP 113/80   Pulse 89   Temp 98.5 F (36.9 C) (Oral)   Ht 5' 1 (1.549 m)   Wt 163 lb 6.4 oz (74.1 kg)   LMP 05/11/2024 (Within Days)   SpO2 98%   BMI 30.87 kg/m   Wt Readings from Last 3 Encounters:  06/13/24 163 lb 6.4 oz (74.1 kg)  05/11/24 162 lb (73.5 kg)  10/21/23 164 lb 3.2 oz (74.5 kg)    Physical Exam Vitals and nursing note reviewed. Exam conducted with a chaperone present Bonney Metro, CMA).  Constitutional:      General: She is not in acute distress.    Appearance: Normal appearance. She is normal weight. She is not ill-appearing, toxic-appearing or diaphoretic.  HENT:     Head: Normocephalic.     Right Ear: External ear normal.     Left Ear: External ear normal.     Nose: Nose normal.     Mouth/Throat:     Mouth: Mucous membranes are moist.     Pharynx: Oropharynx is clear.  Eyes:     General:        Right eye: No discharge.        Left eye: No discharge.     Extraocular Movements: Extraocular movements intact.     Conjunctiva/sclera: Conjunctivae normal.     Pupils:  Pupils are equal, round, and reactive to light.  Cardiovascular:     Rate and Rhythm: Normal rate and regular rhythm.     Heart sounds: No murmur heard. Pulmonary:     Effort: Pulmonary effort is normal. No respiratory distress.     Breath sounds: Normal breath sounds. No wheezing or rales.  Genitourinary:    Vagina: Normal.     Cervix: Normal.     Adnexa: Right adnexa normal and left adnexa normal.  Musculoskeletal:     Cervical back: Normal range of motion and neck supple.  Skin:    General: Skin is warm and dry.     Capillary Refill: Capillary refill takes less than 2 seconds.  Neurological:     General: No focal deficit present.     Mental Status: She is alert and oriented to person, place, and time. Mental status is at baseline.  Psychiatric:        Mood and Affect: Mood normal.  Behavior: Behavior normal.        Thought Content: Thought content normal.        Judgment: Judgment normal.     Results for orders placed or performed in visit on 05/11/24  CBC with Differential/Platelet   Collection Time: 05/11/24  4:06 PM  Result Value Ref Range   WBC 11.4 (H) 3.4 - 10.8 x10E3/uL   RBC 4.42 3.77 - 5.28 x10E6/uL   Hemoglobin 13.6 11.1 - 15.9 g/dL   Hematocrit 59.4 65.9 - 46.6 %   MCV 92 79 - 97 fL   MCH 30.8 26.6 - 33.0 pg   MCHC 33.6 31.5 - 35.7 g/dL   RDW 87.8 88.2 - 84.5 %   Platelets 337 150 - 450 x10E3/uL   Neutrophils 64 Not Estab. %   Lymphs 25 Not Estab. %   Monocytes 7 Not Estab. %   Eos 4 Not Estab. %   Basos 0 Not Estab. %   Neutrophils Absolute 7.3 (H) 1.4 - 7.0 x10E3/uL   Lymphocytes Absolute 2.8 0.7 - 3.1 x10E3/uL   Monocytes Absolute 0.8 0.1 - 0.9 x10E3/uL   EOS (ABSOLUTE) 0.4 0.0 - 0.4 x10E3/uL   Basophils Absolute 0.0 0.0 - 0.2 x10E3/uL   Immature Granulocytes 0 Not Estab. %   Immature Grans (Abs) 0.0 0.0 - 0.1 x10E3/uL  Comprehensive metabolic panel with GFR   Collection Time: 05/11/24  4:06 PM  Result Value Ref Range   Glucose 93 70 - 99  mg/dL   BUN 13 6 - 20 mg/dL   Creatinine, Ser 9.30 0.57 - 1.00 mg/dL   eGFR 876 >40 fO/fpw/8.26   BUN/Creatinine Ratio 19 9 - 23   Sodium 136 134 - 144 mmol/L   Potassium 4.3 3.5 - 5.2 mmol/L   Chloride 100 96 - 106 mmol/L   CO2 20 20 - 29 mmol/L   Calcium 9.3 8.7 - 10.2 mg/dL   Total Protein 7.2 6.0 - 8.5 g/dL   Albumin 4.4 4.0 - 5.0 g/dL   Globulin, Total 2.8 1.5 - 4.5 g/dL   Bilirubin Total 0.2 0.0 - 1.2 mg/dL   Alkaline Phosphatase 112 44 - 121 IU/L   AST 39 0 - 40 IU/L   ALT 83 (H) 0 - 32 IU/L  Lipid panel   Collection Time: 05/11/24  4:06 PM  Result Value Ref Range   Cholesterol, Total 142 100 - 199 mg/dL   Triglycerides 871 0 - 149 mg/dL   HDL 46 >60 mg/dL   VLDL Cholesterol Cal 23 5 - 40 mg/dL   LDL Chol Calc (NIH) 73 0 - 99 mg/dL   Chol/HDL Ratio 3.1 0.0 - 4.4 ratio  TSH   Collection Time: 05/11/24  4:06 PM  Result Value Ref Range   TSH 0.451 0.450 - 4.500 uIU/mL      Assessment & Plan:   Problem List Items Addressed This Visit   None Visit Diagnoses       Recurrent boils    -  Primary   Suspect Hidradenitis.  Will refer to derm.   Relevant Orders   Ambulatory referral to Dermatology     Encounter for Papanicolaou smear for cervical cancer screening       Relevant Orders   Cytology - PAP        Follow up plan: No follow-ups on file.

## 2024-06-14 ENCOUNTER — Encounter: Payer: Self-pay | Admitting: Nurse Practitioner

## 2024-06-19 LAB — CYTOLOGY - PAP: Diagnosis: NEGATIVE

## 2024-06-20 ENCOUNTER — Encounter: Payer: Self-pay | Admitting: Nurse Practitioner

## 2024-06-20 ENCOUNTER — Ambulatory Visit: Payer: Self-pay | Admitting: Nurse Practitioner

## 2024-11-16 ENCOUNTER — Other Ambulatory Visit: Payer: Self-pay | Admitting: Nurse Practitioner

## 2024-12-12 ENCOUNTER — Other Ambulatory Visit: Payer: Self-pay | Admitting: Nurse Practitioner

## 2024-12-13 NOTE — Telephone Encounter (Signed)
 Requested medication (s) are due for refill today: see below  Requested medication (s) are on the active medication list: yes  Last refill:  11/16/24 #30/0  Future visit scheduled: no  Notes to clinic:  Pharmacy comment: REQUEST FOR 90 DAYS PRESCRIPTION. Pt is due for OV per protocol     Requested Prescriptions  Pending Prescriptions Disp Refills   doxepin  (SINEQUAN ) 50 MG capsule [Pharmacy Med Name: DOXEPIN  50 MG CAPSULE] 90 capsule 1    Sig: TAKE 1 CAPSULE (50 MG TOTAL) BY MOUTH AT BEDTIME AS NEEDED.     Psychiatry:  Antidepressants - Heterocyclics (TCAs) Failed - 12/13/2024 12:26 PM      Failed - Valid encounter within last 6 months    Recent Outpatient Visits           6 months ago Recurrent boils   [place] [facility] Melvin Pao, NP   7 months ago Annual physical exam   [place] [facility] Melvin Pao, NP             Psychiatry:  Antidepressants - Heterocyclics (TCAs) - doxepin  Passed - 12/13/2024 12:26 PM      Passed - Valid encounter within last 12 months    Recent Outpatient Visits           6 months ago Recurrent boils   [place] [facility] Melvin Pao, NP   7 months ago Annual physical exam   [place] [facility] Melvin Pao, NP

## 2024-12-14 NOTE — Telephone Encounter (Signed)
 Called patient, lvm for patient to call office and schedule overdue office visit for med refill

## 2024-12-16 ENCOUNTER — Other Ambulatory Visit: Payer: Self-pay | Admitting: Nurse Practitioner

## 2024-12-16 NOTE — Telephone Encounter (Signed)
 Requested medication (s) are due for refill today: yes  Requested medication (s) are on the active medication list: yes  Last refill:  11/16/24  Future visit scheduled: {no  Notes to clinic: Unable to refill per protocol, courtesy refill already given, routing for provider approval.       Requested Prescriptions  Pending Prescriptions Disp Refills   doxepin  (SINEQUAN ) 50 MG capsule [Pharmacy Med Name: DOXEPIN  50 MG CAPSULE] 30 capsule 0    Sig: TAKE 1 CAPSULE (50 MG TOTAL) BY MOUTH AT BEDTIME AS NEEDED.     Psychiatry:  Antidepressants - Heterocyclics (TCAs) Failed - 12/16/2024  4:10 PM      Failed - Valid encounter within last 6 months    Recent Outpatient Visits           6 months ago Recurrent boils   [place] [facility] Melvin Pao, NP   7 months ago Annual physical exam   [facility] Melvin Pao, NP             Psychiatry:  Antidepressants - Heterocyclics (TCAs) - doxepin  Passed - 12/16/2024  4:10 PM      Passed - Valid encounter within last 12 months    Recent Outpatient Visits           6 months ago Recurrent boils   [place] [facility] Melvin Pao, NP   7 months ago Annual physical exam    [facility] Melvin Pao, NP

## 2024-12-16 NOTE — Telephone Encounter (Signed)
 2nd attempt to reach patient..Called patient and left a message for patient to call back to get scheduled for the overdue follow up.

## 2024-12-16 NOTE — Telephone Encounter (Signed)
 [facility] Refill Encounter  Last office visit: 06/03/2024  Verified last visit within 6 months and next visit is scheduled: No   Next office visit: Visit date not found   Patient record reviewed by CMA and medication due for refill: Yes  Requested Prescriptions   Pending Prescriptions Disp Refills   doxepin  (SINEQUAN ) 50 MG capsule [Pharmacy Med Name: DOXEPIN  50 MG CAPSULE] 30 capsule 0    Sig: TAKE 1 CAPSULE (50 MG TOTAL) BY MOUTH AT BEDTIME AS NEEDED.

## 2024-12-19 ENCOUNTER — Encounter: Payer: Self-pay | Admitting: Nurse Practitioner

## 2024-12-20 ENCOUNTER — Other Ambulatory Visit: Payer: Self-pay

## 2024-12-20 ENCOUNTER — Ambulatory Visit: Admitting: Nurse Practitioner

## 2024-12-20 ENCOUNTER — Encounter: Payer: Self-pay | Admitting: Nurse Practitioner

## 2024-12-20 DIAGNOSIS — F3181 Bipolar II disorder: Secondary | ICD-10-CM

## 2024-12-20 DIAGNOSIS — R112 Nausea with vomiting, unspecified: Secondary | ICD-10-CM

## 2024-12-20 DIAGNOSIS — R1084 Generalized abdominal pain: Secondary | ICD-10-CM | POA: Diagnosis not present

## 2024-12-20 MED ORDER — DOXEPIN HCL 50 MG PO CAPS: 50.0000 mg | ORAL_CAPSULE | Freq: Every evening | ORAL | 1 refills | Status: AC | PRN

## 2024-12-20 MED ORDER — LAMOTRIGINE 25 MG PO TABS: ORAL_TABLET | ORAL | 1 refills | Status: AC

## 2024-12-20 MED ORDER — SERTRALINE HCL 50 MG PO TABS: 50.0000 mg | ORAL_TABLET | Freq: Every day | ORAL | 1 refills | Status: AC

## 2024-12-20 NOTE — Progress Notes (Signed)
 "  BP 118/73 (BP Location: Left Arm, Patient Position: Sitting, Cuff Size: Normal)   Pulse (!) 112   Temp 98.6 F (37 C) (Oral)   Resp 15   Ht 5' 0.98 (1.549 m)   Wt 162 lb 6.4 oz (73.7 kg)   LMP 12/17/2024 (Exact Date)   SpO2 98%   BMI 30.70 kg/m    Subjective:    Patient ID: Kelly Pace, female    DOB: [birth year], 24 y.o.   MRN: [id number]  HPI: Kelly Pace is a 24 y.o. female  Chief Complaint  Patient presents with   Mood     Doing overall well on her current regimen.    MOOD Patient states she feels like her mood has been good. She is taking the Doxepin , Lamictal  25mg  and Zoloft  50mg .  She feels like she is doing well. She does feel like work stresses her out.  Denies concerns at visit today.  She is currently working over nights.  Denies SI.    Flowsheet Row Office Visit from 12/20/2024 in [facility]  PHQ-9 Total Score 6      12/20/2024    9:08 AM 06/13/2024    8:19 AM 05/11/2024    4:05 PM 10/21/2023    3:47 PM  GAD 7 : Generalized Anxiety Score  Nervous, Anxious, on Edge 1 1  1  2    Control/stop worrying 1 0  1  0   Worry too much - different things 1 0  2  2   Trouble relaxing 1 1  1  1    Restless 0 0  1  1   Easily annoyed or irritable 2 2  3  2    Afraid - awful might happen 0 0  1  0   Total GAD 7 Score 6 4 10 8   Anxiety Difficulty Somewhat difficult Somewhat difficult Very difficult      Data saved with a previous flowsheet row definition     Relevant past medical, surgical, family and social history reviewed and updated as indicated. Interim medical history since our last visit reviewed. Allergies and medications reviewed and updated.  Review of Systems  Psychiatric/Behavioral:  Positive for dysphoric mood. Negative for suicidal ideas. The patient is nervous/anxious.     Per HPI unless specifically indicated above     Objective:    BP 118/73 (BP Location: Left Arm, Patient Position: Sitting, Cuff Size: Normal)   Pulse  (!) 112   Temp 98.6 F (37 C) (Oral)   Resp 15   Ht 5' 0.98 (1.549 m)   Wt 162 lb 6.4 oz (73.7 kg)   LMP 12/17/2024 (Exact Date)   SpO2 98%   BMI 30.70 kg/m   Wt Readings from Last 3 Encounters:  12/20/24 162 lb 6.4 oz (73.7 kg)  06/13/24 163 lb 6.4 oz (74.1 kg)  05/11/24 162 lb (73.5 kg)    Physical Exam  Results for orders placed or performed in visit on 06/13/24  Cytology - PAP   Collection Time: 06/13/24  8:26 AM  Result Value Ref Range   Adequacy      Satisfactory for evaluation; transformation zone component PRESENT.   Diagnosis      - Negative for intraepithelial lesion or malignancy (NILM)   Microorganisms Shift in flora suggestive of bacterial vaginosis       Assessment & Plan:   Problem List Items Addressed This Visit   None    Follow up plan: No  follow-ups on file.      "

## 2024-12-20 NOTE — Telephone Encounter (Signed)
 This encounter was created in error - please disregard.

## 2024-12-20 NOTE — Telephone Encounter (Signed)
 Patient checking on the status of her medication refill request for her doxepin  (SINEQUAN ) 50 MG capsule. Patient stated she will take her last pill and will not have enough medication until her appointment.

## 2024-12-20 NOTE — Telephone Encounter (Signed)
Scheduled and seen. °

## 2024-12-22 ENCOUNTER — Ambulatory Visit: Admitting: Nurse Practitioner

## 2025-06-20 ENCOUNTER — Encounter: Admitting: Nurse Practitioner
